# Patient Record
Sex: Female | Born: 1970 | Race: White | Hispanic: No | Marital: Married | Smoking: Former smoker
Health system: Southern US, Community
[De-identification: ages and names within clinical notes are randomized; demographics above are authoritative.]

## PROBLEM LIST (undated history)

## (undated) ENCOUNTER — Emergency Department: Payer: 59

## (undated) DIAGNOSIS — M47816 Spondylosis without myelopathy or radiculopathy, lumbar region: Secondary | ICD-10-CM

## (undated) DIAGNOSIS — Z8619 Personal history of other infectious and parasitic diseases: Secondary | ICD-10-CM

## (undated) DIAGNOSIS — F419 Anxiety disorder, unspecified: Secondary | ICD-10-CM

## (undated) DIAGNOSIS — K219 Gastro-esophageal reflux disease without esophagitis: Secondary | ICD-10-CM

## (undated) DIAGNOSIS — M719 Bursopathy, unspecified: Secondary | ICD-10-CM

## (undated) DIAGNOSIS — T7840XA Allergy, unspecified, initial encounter: Secondary | ICD-10-CM

## (undated) DIAGNOSIS — E785 Hyperlipidemia, unspecified: Secondary | ICD-10-CM

## (undated) DIAGNOSIS — I10 Essential (primary) hypertension: Secondary | ICD-10-CM

## (undated) DIAGNOSIS — M1990 Unspecified osteoarthritis, unspecified site: Secondary | ICD-10-CM

## (undated) DIAGNOSIS — E119 Type 2 diabetes mellitus without complications: Secondary | ICD-10-CM

## (undated) DIAGNOSIS — F32A Depression, unspecified: Secondary | ICD-10-CM

## (undated) DIAGNOSIS — G5603 Carpal tunnel syndrome, bilateral upper limbs: Secondary | ICD-10-CM

## (undated) HISTORY — DX: Unspecified osteoarthritis, unspecified site: M19.90

## (undated) HISTORY — DX: Allergy, unspecified, initial encounter: T78.40XA

## (undated) HISTORY — DX: Spondylosis without myelopathy or radiculopathy, lumbar region: M47.816

## (undated) HISTORY — DX: Bursopathy, unspecified: M71.9

## (undated) HISTORY — DX: Hyperlipidemia, unspecified: E78.5

## (undated) HISTORY — DX: Personal history of other infectious and parasitic diseases: Z86.19

## (undated) HISTORY — DX: Anxiety disorder, unspecified: F41.9

## (undated) HISTORY — PX: LESION EXCISION: SHX5167

## (undated) HISTORY — DX: Type 2 diabetes mellitus without complications: E11.9

## (undated) HISTORY — DX: Depression, unspecified: F32.A

---

## 1995-04-10 HISTORY — PX: TUBAL LIGATION: SHX77

## 2005-03-15 ENCOUNTER — Ambulatory Visit: Payer: Self-pay

## 2009-09-20 ENCOUNTER — Ambulatory Visit: Payer: Self-pay | Admitting: Family Medicine

## 2013-09-17 ENCOUNTER — Ambulatory Visit: Payer: Self-pay | Admitting: Emergency Medicine

## 2015-03-24 ENCOUNTER — Ambulatory Visit
Admission: EM | Admit: 2015-03-24 | Discharge: 2015-03-24 | Disposition: A | Discharge status: Home / Self Care | Payer: BLUE CROSS/BLUE SHIELD | Attending: Family Medicine | Admitting: Family Medicine

## 2015-03-24 ENCOUNTER — Encounter: Payer: Self-pay | Admitting: Emergency Medicine

## 2015-03-24 DIAGNOSIS — R05 Cough: Secondary | ICD-10-CM

## 2015-03-24 DIAGNOSIS — R059 Cough, unspecified: Secondary | ICD-10-CM

## 2015-03-24 DIAGNOSIS — J321 Chronic frontal sinusitis: Secondary | ICD-10-CM | POA: Diagnosis not present

## 2015-03-24 HISTORY — DX: Essential (primary) hypertension: I10

## 2015-03-24 LAB — RAPID STREP SCREEN (MED CTR MEBANE ONLY): Streptococcus, Group A Screen (Direct): NEGATIVE

## 2015-03-24 MED ORDER — CEFUROXIME AXETIL 250 MG PO TABS: ORAL_TABLET | ORAL | Status: DC

## 2015-03-24 MED ORDER — AZITHROMYCIN 500 MG PO TABS: ORAL_TABLET | ORAL | Status: DC

## 2015-03-24 MED ORDER — HYDROCOD POLST-CPM POLST ER 10-8 MG/5ML PO SUER: 5.0000 mL | Freq: Two times a day (BID) | ORAL | Status: DC | PRN

## 2015-03-24 NOTE — ED Notes (Signed)
Patient c/o sore throat and sinus congestion since last Friday.  Patient denies fevers.

## 2015-03-24 NOTE — ED Provider Notes (Signed)
CSN: [id number]     Arrival date & time 03/24/15  1102 History   None    Chief Complaint  Patient presents with  . Sore Throat  . Sinus Problem   (Consider location/radiation/quality/duration/timing/severity/associated sxs/prior Treatment) HPI  This 44 year old female presents with a one-week history of sore throat and sinus congestion. She states she has recurrent sinusitis in the past evaluated by an ENT wanted to perform surgery but she had vertigo that they had to cure first. She never went back for the sinus surgery to this point. 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 Several have been sick according to the patient. He is coughing a great deal of nighttime from pooling of secretions in her throat. Had some low-grade temperature last night although she is afebrile today. She states her left-sided sinuses the worse and tends to be stopped up "all the time".   Past Medical History  Diagnosis Date  . Hypertension    Past Surgical History  Procedure Laterality Date  . Tubal ligation     History reviewed. No pertinent family history. Social History  Substance Use Topics  . Smoking status: Former Research scientist (life sciences)  . Smokeless tobacco: None  . Alcohol Use: No   OB History    No data available     Review of Systems  Constitutional: Positive for fever and chills. Negative for diaphoresis and activity change.  HENT: Positive for congestion, postnasal drip, rhinorrhea, sinus pressure and sore throat.   Respiratory: Positive for cough.   All other systems reviewed and are negative.   Allergies  Codeine; Penicillins; and Krill oil  Home Medications   Prior to Admission medications   Medication Sig Start Date End Date Taking? Authorizing Provider  busPIRone (BUSPAR) 30 MG tablet Take 30 mg by mouth 2 (two) times daily.   Yes Historical Provider, MD  fluticasone (FLONASE) 50 MCG/ACT nasal spray Place 2 sprays into both nostrils daily.    Yes Historical Provider, MD  lovastatin (MEVACOR) 40 MG tablet Take 40 mg by mouth at bedtime.   Yes Historical Provider, MD  montelukast (SINGULAIR) 10 MG tablet Take 10 mg by mouth at bedtime.   Yes Historical Provider, MD  ramipril (ALTACE) 10 MG capsule Take 10 mg by mouth daily.   Yes Historical Provider, MD  cefUROXime (CEFTIN) 250 MG tablet Take one tablet BID with food 03/24/15   Lorin Picket, PA-C  chlorpheniramine-HYDROcodone [facility]) 10-8 MG/5ML SUER Take 5 mLs by mouth every 12 (twelve) hours as needed for cough. 03/24/15   Frederich Cha, MD   Meds Ordered and Administered this Visit  Medications - No data to display  BP 156/92 mmHg  Pulse 68  Temp(Src) 97.4 F (36.3 C) (Tympanic)  Resp 16  Ht 5\' 4"  (1.626 m)  Wt 186 lb (84.369 kg)  BMI 31.91 kg/m2  SpO2 100%  LMP 03/10/2015 (Approximate) No data found.   Physical Exam  Constitutional: She is oriented to person, place, and time. She appears well-developed and well-nourished. No distress.  HENT:  Head: Normocephalic and atraumatic.  Right Ear: External ear normal.  Left Ear: External ear normal.  Nose: Nose normal.  Mouth/Throat: Oropharynx is clear and moist. No oropharyngeal exudate.  Patient has significant tenderness to percussion over the frontal maxillary sinuses bilaterally.  Eyes: Conjunctivae are normal. Pupils are equal, round, and reactive to light.  Neck: Normal range of motion. Neck supple.  Pulmonary/Chest: Effort normal  and breath sounds normal. No respiratory distress. She has no wheezes. She has no rales.  Musculoskeletal: Normal range of motion. She exhibits no edema or tenderness.  Lymphadenopathy:    She has no cervical adenopathy.  Neurological: She is alert and oriented to person, place, and time.  Skin: Skin is warm and dry. She is not diaphoretic.  Psychiatric: She has a normal mood and affect. Her behavior is normal. Judgment normal.  Nursing note and vitals  reviewed.   ED Course  Procedures (including critical care time)  Labs Review Labs Reviewed  RAPID STREP SCREEN (NOT AT [facility])  CULTURE, GROUP A STREP ([facility] ONLY)    Imaging Review No results found.   Visual Acuity Review  Right Eye Distance:   Left Eye Distance:   Bilateral Distance:    Right Eye Near:   Left Eye Near:    Bilateral Near:         MDM   1. Cough   2. Sinusitis chronic, frontal    Discharge Medication List as of 03/24/2015  2:44 PM    START taking these medications   Details  cefUROXime (CEFTIN) 250 MG tablet Take one tablet BID with food, Print    chlorpheniramine-HYDROcodone (TUSSIONEX PENNKINETIC ER) 10-8 MG/5ML SUER Take 5 mLs by mouth every 12 (twelve) hours as needed for cough., Starting 03/24/2015, Until Discontinued, Normal      Plan: 1. Test/x-ray results and diagnosis reviewed with patient 2. rx as per orders; risks, benefits, potential side effects reviewed with patient 3. Recommend supportive treatment with rest and fluids. I recommended the use of Flonase for the next month to promote drainage. I will keep her out of work today and tomorrow to allow her to improve. She will return to work on Monday without restrictions. I recommended she follow-up with her primary care physician if she continued to have symptoms within a week or 2. 4. F/u prn if symptoms worsen or don't improve     Lorin Picket, PA-C 03/24/15 1458  Lorin Picket, PA-C 03/24/15 1459

## 2015-03-24 NOTE — Discharge Instructions (Signed)
Sinusitis, Adult Sinusitis is redness, soreness, and puffiness (inflammation) of the air pockets in the bones of your face (sinuses). The redness, soreness, and puffiness can cause air and mucus to get trapped in your sinuses. This can allow germs to grow and cause an infection.  HOME CARE   Drink enough fluids to keep your pee (urine) clear or pale yellow.  Use a humidifier in your home.  Run a hot shower to create steam in the bathroom. Sit in the bathroom with the door closed. Breathe in the steam 3-4 times a day.  Put a warm, moist washcloth on your face 3-4 times a day, or as told by your doctor.  Use salt water sprays (saline sprays) to wet the thick fluid in your nose. This can help the sinuses drain.  Only take medicine as told by your doctor. GET HELP RIGHT AWAY IF:   Your pain gets worse.  You have very bad headaches.  You are sick to your stomach (nauseous).  You throw up (vomit).  You are very sleepy (drowsy) all the time.  Your face is puffy (swollen).  Your vision changes.  You have a stiff neck.  You have trouble breathing. MAKE SURE YOU:   Understand these instructions.  Will watch your condition.  Will get help right away if you are not doing well or get worse.   This information is not intended to replace advice given to you by your health care provider. Make sure you discuss any questions you have with your health care provider.   Document Released: 09/12/2007 Document Revised: 04/16/2014 Document Reviewed: 10/30/2011 Elsevier Interactive Patient Education 2016 Elsevier Inc.  Cough, Adult A cough helps to clear your throat and lungs. A cough may last only 2-3 weeks (acute), or it may last longer than 8 weeks (chronic). Many different things can cause a cough. A cough may be a sign of an illness or another medical condition. HOME CARE  Pay attention to any changes in your cough.  Take medicines only as told by your doctor.  If you were  prescribed an antibiotic medicine, take it as told by your doctor. Do not stop taking it even if you start to feel better.  Talk with your doctor before you try using a cough medicine.  Drink enough fluid to keep your pee (urine) clear or pale yellow.  If the air is dry, use a cold steam vaporizer or humidifier in your home.  Stay away from things that make you cough at work or at home.  If your cough is worse at night, try using extra pillows to raise your head up higher while you sleep.  Do not smoke, and try not to be around smoke. If you need help quitting, ask your doctor.  Do not have caffeine.  Do not drink alcohol.  Rest as needed. GET HELP IF:  You have new problems (symptoms).  You cough up yellow fluid (pus).  Your cough does not get better after 2-3 weeks, or your cough gets worse.  Medicine does not help your cough and you are not sleeping well.  You have pain that gets worse or pain that is not helped with medicine.  You have a fever.  You are losing weight and you do not know why.  You have night sweats. GET HELP RIGHT AWAY IF:  You cough up blood.  You have trouble breathing.  Your heartbeat is very fast.   This information is not intended to replace advice  given to you by your health care provider. Make sure you discuss any questions you have with your health care provider.   Document Released: 12/07/2010 Document Revised: 12/15/2014 Document Reviewed: 06/02/2014 Elsevier Interactive Patient Education 2016 [place] may help relieve the symptoms of a cough and cold. They add moisture to the air, which helps mucus to become thinner and less sticky. This makes it easier to breathe and cough up secretions. Cool mist vaporizers do not cause serious burns like hot mist vaporizers, which may also be called steamers or humidifiers. Vaporizers have not been proven to help with colds. You should not use a vaporizer if you  are allergic to mold. HOME CARE INSTRUCTIONS  Follow the package instructions for the vaporizer.  Do not use anything other than distilled water in the vaporizer.  Do not run the vaporizer all of the time. This can cause mold or bacteria to grow in the vaporizer.  Clean the vaporizer after each time it is used.  Clean and dry the vaporizer well before storing it.  Stop using the vaporizer if worsening respiratory symptoms develop.   This information is not intended to replace advice given to you by your health care provider. Make sure you discuss any questions you have with your health care provider.   Document Released: 12/22/2003 Document Revised: 03/31/2013 Document Reviewed: 08/13/2012 Elsevier Interactive Patient Education 2016 Elsevier Inc.  Sinusitis, Adult Sinusitis is redness, soreness, and inflammation of the paranasal sinuses. Paranasal sinuses are air pockets within the bones of your face. They are located beneath your eyes, in the middle of your forehead, and above your eyes. In healthy paranasal sinuses, mucus is able to drain out, and air is able to circulate through them by way of your nose. However, when your paranasal sinuses are inflamed, mucus and air can become trapped. This can allow bacteria and other germs to grow and cause infection. Sinusitis can develop quickly and last only a short time (acute) or continue over a long period (chronic). Sinusitis that lasts for more than 12 weeks is considered chronic. CAUSES Causes of sinusitis include:  Allergies.  Structural abnormalities, such as displacement of the cartilage that separates your nostrils (deviated septum), which can decrease the air flow through your nose and sinuses and affect sinus drainage.  Functional abnormalities, such as when the small hairs (cilia) that line your sinuses and help remove mucus do not work properly or are not present. SIGNS AND SYMPTOMS Symptoms of acute and chronic sinusitis are  the same. The primary symptoms are pain and pressure around the affected sinuses. Other symptoms include:  Upper toothache.  Earache.  Headache.  Bad breath.  Decreased sense of smell and taste.  A cough, which worsens when you are lying flat.  Fatigue.  Fever.  Thick drainage from your nose, which often is green and may contain pus (purulent).  Swelling and warmth over the affected sinuses. DIAGNOSIS Your health care provider will perform a physical exam. During your exam, your health care provider may perform any of the following to help determine if you have acute sinusitis or chronic sinusitis:  Look in your nose for signs of abnormal growths in your nostrils (nasal polyps).  Tap over the affected sinus to check for signs of infection.  View the inside of your sinuses using an imaging device that has a light attached (endoscope). If your health care provider suspects that you have chronic sinusitis, one or more of the following  tests may be recommended:  Allergy tests.  Nasal culture. A sample of mucus is taken from your nose, sent to a lab, and screened for bacteria.  Nasal cytology. A sample of mucus is taken from your nose and examined by your health care provider to determine if your sinusitis is related to an allergy. TREATMENT Most cases of acute sinusitis are related to a viral infection and will resolve on their own within 10 days. Sometimes, medicines are prescribed to help relieve symptoms of both acute and chronic sinusitis. These may include pain medicines, decongestants, nasal steroid sprays, or saline sprays. However, for sinusitis related to a bacterial infection, your health care provider will prescribe antibiotic medicines. These are medicines that will help kill the bacteria causing the infection. Rarely, sinusitis is caused by a fungal infection. In these cases, your health care provider will prescribe antifungal medicine. For some cases of chronic  sinusitis, surgery is needed. Generally, these are cases in which sinusitis recurs more than 3 times per year, despite other treatments. HOME CARE INSTRUCTIONS  Drink plenty of water. Water helps thin the mucus so your sinuses can drain more easily.  Use a humidifier.  Inhale steam 3-4 times a day (for example, sit in the bathroom with the shower running).  Apply a warm, moist washcloth to your face 3-4 times a day, or as directed by your health care provider.  Use saline nasal sprays to help moisten and clean your sinuses.  Take medicines only as directed by your health care provider.  If you were prescribed either an antibiotic or antifungal medicine, finish it all even if you start to feel better. SEEK IMMEDIATE MEDICAL CARE IF:  You have increasing pain or severe headaches.  You have nausea, vomiting, or drowsiness.  You have swelling around your face.  You have vision problems.  You have a stiff neck.  You have difficulty breathing.   This information is not intended to replace advice given to you by your health care provider. Make sure you discuss any questions you have with your health care provider.   Document Released: 03/26/2005 Document Revised: 04/16/2014 Document Reviewed: 04/10/2011 Elsevier Interactive Patient Education [employer].

## 2015-03-26 LAB — CULTURE, GROUP A STREP (THRC)

## 2015-04-26 ENCOUNTER — Encounter (INDEPENDENT_AMBULATORY_CARE_PROVIDER_SITE_OTHER): Payer: Self-pay

## 2015-04-26 ENCOUNTER — Ambulatory Visit (INDEPENDENT_AMBULATORY_CARE_PROVIDER_SITE_OTHER): Payer: BLUE CROSS/BLUE SHIELD | Admitting: Family Medicine

## 2015-04-26 ENCOUNTER — Encounter: Payer: Self-pay | Admitting: Family Medicine

## 2015-04-26 VITALS — BP 138/90 | HR 86 | Temp 98.6°F | Resp 19 | Ht 64.0 in | Wt 195.9 lb

## 2015-04-26 DIAGNOSIS — E785 Hyperlipidemia, unspecified: Secondary | ICD-10-CM

## 2015-04-26 DIAGNOSIS — F419 Anxiety disorder, unspecified: Secondary | ICD-10-CM | POA: Diagnosis not present

## 2015-04-26 DIAGNOSIS — M1990 Unspecified osteoarthritis, unspecified site: Secondary | ICD-10-CM

## 2015-04-26 DIAGNOSIS — I10 Essential (primary) hypertension: Secondary | ICD-10-CM | POA: Insufficient documentation

## 2015-04-26 DIAGNOSIS — J3089 Other allergic rhinitis: Secondary | ICD-10-CM | POA: Insufficient documentation

## 2015-04-26 NOTE — Progress Notes (Signed)
Name: Morgan Gordon   MRN: [id number]    DOB: 1970/09/28   Date:04/26/2015       Progress Note  Subjective  Chief Complaint  Chief Complaint  Patient presents with  . Establish Care    NP    Hypertension This is a chronic problem. The problem is controlled. Pertinent negatives include no chest pain, headaches, palpitations or shortness of breath. Past treatments include ACE inhibitors. The current treatment provides significant improvement. There is no history of kidney disease, CAD/MI or CVA.    Pt. Is here to establish care. PCP Dr. Doren Custard, who is nearing retirement.  Records not available for review.  Past Medical History  Diagnosis Date  . Hypertension   . Anxiety   . Bursitis     arms and neck  . Arthritis     Osteoarthritis in hips  . Facet syndrome, lumbar     Sees Chiropractor in [place]  . Hyperlipidemia   . Allergy     Year-round allergies and sinus infections.  . History of genital warts     Had D and C    Past Surgical History  Procedure Laterality Date  . Tubal ligation  1997    Family History  Problem Relation Age of Onset  . Thyroid disease Mother   . Cancer Mother     breast  . Diabetes Mother   . Cancer Father     lymph node cancer  . Diabetes Father   . Diabetes Brother   . Thyroid disease Brother     Social History   Social History  . Marital Status: Married    Spouse Name: N/A  . Number of Children: N/A  . Years of Education: N/A   Occupational History  . Not on file.   Social History Main Topics  . Smoking status: Former Smoker -- 0.50 packs/day    Types: Cigarettes    Quit date: 06/24/2013  . Smokeless tobacco: Not on file  . Alcohol Use: No  . Drug Use: No  . Sexual Activity: Yes   Other Topics Concern  . Not on file   Social History Narrative     Current outpatient prescriptions:  .  aspirin 81 MG tablet, Take 81 mg by mouth daily., Disp: , Rfl:  .  busPIRone (BUSPAR) 30 MG tablet, Take 30 mg by mouth 2  (two) times daily., Disp: , Rfl:  .  Cinnamon 500 MG capsule, Take 500 mg by mouth daily., Disp: , Rfl:  .  fluticasone (FLONASE) 50 MCG/ACT nasal spray, Place 2 sprays into both nostrils daily., Disp: , Rfl:  .  lovastatin (MEVACOR) 40 MG tablet, Take 40 mg by mouth at bedtime., Disp: , Rfl:  .  montelukast (SINGULAIR) 10 MG tablet, Take 10 mg by mouth at bedtime., Disp: , Rfl:  .  ramipril (ALTACE) 10 MG capsule, Take 10 mg by mouth daily., Disp: , Rfl:   Allergies  Allergen Reactions  . Codeine Hives  . Penicillins Hives  . Krill Oil Rash     Review of Systems  Constitutional: Negative for fever, chills and weight loss.  Respiratory: Negative for shortness of breath.   Cardiovascular: Negative for chest pain and palpitations.  Neurological: Negative for headaches.   Objective  Filed Vitals:   04/26/15 1404 04/26/15 1515  BP: 150/90 138/90  Pulse: 86   Temp: 98.6 F (37 C)   TempSrc: Oral   Resp: 19   Height: 5\' 4"  (1.626 m)   Weight:  195 lb 14.4 oz (88.86 kg)   SpO2: 98%     Physical Exam  Constitutional: She is oriented to person, place, and time and well-developed, well-nourished, and in no distress.  HENT:  Head: Normocephalic and atraumatic.  Eyes: Pupils are equal, round, and reactive to light.  Cardiovascular: Normal rate, regular rhythm and normal heart sounds.   Pulmonary/Chest: Effort normal and breath sounds normal.  Abdominal: Soft. Bowel sounds are normal.  Musculoskeletal:       Right ankle: She exhibits no swelling.       Left ankle: She exhibits no swelling.  Neurological: She is alert and oriented to person, place, and time.  Nursing note and vitals reviewed.     Assessment & Plan  1. Essential hypertension BP elevated, improved on manual repeat. We'll continue on ramipril at present dosage and repeat BP logs in 4 weeks.  2. Arthritis We'll request records from Dr. Arnette Norris  3. Anxiety Stable on buspirone. Continue present therapy for  now.  4. Hyperlipidemia Obtain FLP at patient's next OV.   Morgan Gordon A. [facility] 04/26/2015 3:15 PM

## 2015-05-10 ENCOUNTER — Ambulatory Visit (INDEPENDENT_AMBULATORY_CARE_PROVIDER_SITE_OTHER): Payer: BLUE CROSS/BLUE SHIELD | Admitting: Family Medicine

## 2015-05-10 ENCOUNTER — Encounter: Payer: Self-pay | Admitting: Family Medicine

## 2015-05-10 ENCOUNTER — Other Ambulatory Visit: Payer: Self-pay

## 2015-05-10 VITALS — BP 128/78 | HR 96 | Temp 97.7°F | Resp 16 | Ht 64.3 in | Wt 192.7 lb

## 2015-05-10 DIAGNOSIS — J0100 Acute maxillary sinusitis, unspecified: Secondary | ICD-10-CM

## 2015-05-10 DIAGNOSIS — E669 Obesity, unspecified: Secondary | ICD-10-CM | POA: Insufficient documentation

## 2015-05-10 DIAGNOSIS — J069 Acute upper respiratory infection, unspecified: Secondary | ICD-10-CM | POA: Diagnosis not present

## 2015-05-10 DIAGNOSIS — Z23 Encounter for immunization: Secondary | ICD-10-CM

## 2015-05-10 MED ORDER — CEFUROXIME AXETIL 250 MG PO TABS: 250.0000 mg | ORAL_TABLET | Freq: Two times a day (BID) | ORAL | Status: DC

## 2015-05-10 MED ORDER — FLUTICASONE PROPIONATE 50 MCG/ACT NA SUSP: 2.0000 | Freq: Every day | NASAL | Status: DC

## 2015-05-10 NOTE — Progress Notes (Signed)
Name: Morgan Gordon   MRN: [id number]    DOB: [birth year]   Date:05/10/2015       Progress Note  Subjective  Chief Complaint  Chief Complaint  Patient presents with  . URI    Onset Saturday, sinus pressure/pain, chills, bilateral ear pressure and pain, nasal congestion, coughing up yellow mucus. Has tried Mucinex, Neti-Pot and nasal decongestant with some relief.     HPI  URI: she states that she developed chills, nasal congestion, facial pressure and  rhinorrhea, post-nasal drainage, cough, no wheezing or SOB Symptoms are worse at night and better during the day. She has been taking otc medication with some relief of symptoms.    Patient Active Problem List   Diagnosis Date Noted  . Obesity 05/10/2015  . Hypertension 04/26/2015  . Arthritis 04/26/2015  . Anxiety 04/26/2015  . Hyperlipidemia 04/26/2015  . Allergic rhinitis 04/26/2015    Past Surgical History  Procedure Laterality Date  . Tubal ligation  1997    Family History  Problem Relation Age of Onset  . Thyroid disease Mother   . Cancer Mother     breast  . Diabetes Mother   . Cancer Father     lymph node cancer  . Diabetes Father   . Diabetes Brother   . Thyroid disease Brother     Social History   Social History  . Marital Status: Married    Spouse Name: N/A  . Number of Children: N/A  . Years of Education: N/A   Occupational History  . Not on file.   Social History Main Topics  . Smoking status: Former Smoker -- 0.50 packs/day    Types: Cigarettes    Quit date: 06/24/2013  . Smokeless tobacco: Not on file  . Alcohol Use: No  . Drug Use: No  . Sexual Activity: Yes   Other Topics Concern  . Not on file   Social History Narrative     Current outpatient prescriptions:  .  aspirin 81 MG tablet, Take 81 mg by mouth daily., Disp: , Rfl:  .  busPIRone (BUSPAR) 30 MG tablet, Take 30 mg by mouth 2 (two) times daily., Disp: , Rfl:  .  Cinnamon 500 MG capsule, Take 500 mg by mouth daily.,  Disp: , Rfl:  .  fluticasone (FLONASE) 50 MCG/ACT nasal spray, Place 2 sprays into both nostrils daily., Disp: , Rfl:  .  lovastatin (MEVACOR) 40 MG tablet, Take 40 mg by mouth at bedtime., Disp: , Rfl:  .  montelukast (SINGULAIR) 10 MG tablet, Take 10 mg by mouth at bedtime., Disp: , Rfl:  .  ramipril (ALTACE) 10 MG capsule, Take 10 mg by mouth daily., Disp: , Rfl:   Allergies  Allergen Reactions  . Codeine Hives  . Penicillins Hives  . Krill Oil Rash     ROS  Ten systems reviewed and is negative except as mentioned in HPI   Objective  Filed Vitals:   05/10/15 1426  BP: 128/78  Pulse: 96  Temp: 97.7 F (36.5 C)  TempSrc: Oral  Resp: 16  Height: 5' 4.3" (1.633 m)  Weight: 192 lb 11.2 oz (87.408 kg)  SpO2: 99%    Body mass index is 32.78 kg/(m^2).  Physical Exam  Constitutional: Patient appears well-developed and well-nourished. Obese No distress.  HEENT: head atraumatic, normocephalic, pupils equal and reactive to light, ears normal TM bilaterally, mild white post-nasal drainage, pain on maxillary sinus, but left worse than right side, neck supple Cardiovascular: Normal rate,  regular rhythm and normal heart sounds.  No murmur heard. No BLE edema. Pulmonary/Chest: Effort normal and breath sounds normal. No respiratory distress. Abdominal: Soft.  There is no tenderness. Psychiatric: Patient has a normal mood and affect. behavior is normal. Judgment and thought content normal.  Recent Results (from the past 2160 hour(s))  Rapid strep screen     Status: None   Collection Time: 03/24/15  1:46 PM  Result Value Ref Range   Streptococcus, Group A Screen (Direct) NEGATIVE NEGATIVE  Culture, group A strep ([facility] only)     Status: None   Collection Time: 03/24/15  1:46 PM  Result Value Ref Range   Specimen Description THROAT    Special Requests NONE    Culture NO BETA HEMOLYTIC STREPTOCOCCI ISOLATED    Report Status 03/26/2015 FINAL     PHQ2/9: Depression screen PHQ 2/9  04/26/2015  Decreased Interest 0  Down, Depressed, Hopeless 0  PHQ - 2 Score 0    Fall Risk: Fall Risk  04/26/2015  Falls in the past year? No     Assessment & Plan  1. Upper respiratory infection  Discussed otc medication and neti pot  2. Acute maxillary sinusitis, recurrence not specified  Explained that it is likely viral, she can hold off on filling prescription, she is allergic to penicillin but has taken Cefuroxime in the past - cefUROXime (CEFTIN) 250 MG tablet; Take 1 tablet (250 mg total) by mouth 2 (two) times daily with a meal.  Dispense: 20 tablet; Refill: 0   3. Need for Tdap vaccination  - Tdap vaccine greater than or equal to 7yo IM

## 2015-05-10 NOTE — Telephone Encounter (Signed)
Patient requesting refill. 

## 2015-06-02 ENCOUNTER — Encounter: Payer: Self-pay | Admitting: Family Medicine

## 2015-06-02 ENCOUNTER — Ambulatory Visit (INDEPENDENT_AMBULATORY_CARE_PROVIDER_SITE_OTHER): Payer: BLUE CROSS/BLUE SHIELD | Admitting: Family Medicine

## 2015-06-02 VITALS — BP 126/70 | HR 86 | Temp 98.8°F | Resp 17 | Ht 64.0 in | Wt 188.3 lb

## 2015-06-02 DIAGNOSIS — I10 Essential (primary) hypertension: Secondary | ICD-10-CM | POA: Diagnosis not present

## 2015-06-02 DIAGNOSIS — Z833 Family history of diabetes mellitus: Secondary | ICD-10-CM | POA: Insufficient documentation

## 2015-06-02 DIAGNOSIS — E785 Hyperlipidemia, unspecified: Secondary | ICD-10-CM

## 2015-06-02 LAB — POCT GLYCOSYLATED HEMOGLOBIN (HGB A1C): Hemoglobin A1C: 5.9

## 2015-06-02 NOTE — Progress Notes (Signed)
Name: Morgan Gordon   MRN: [id number]    DOB: [birth year]   Date:06/02/2015       Progress Note  Subjective  Chief Complaint  Chief Complaint  Patient presents with  . Follow-up    1 mo  . Hypertension  . Hyperlipidemia  . Anxiety    Hypertension This is a chronic problem. The problem is controlled. Pertinent negatives include no headaches or shortness of breath. Past treatments include ACE inhibitors.  Hyperlipidemia This is a chronic problem. Pertinent negatives include no leg pain, myalgias or shortness of breath. Current antihyperlipidemic treatment includes statins.   Family History of Diabetes Strong family history of Diabetes (mom, dad, and brother). In the past, she was 'borderline' but is concerned that with smoking cessation, she has gained some weight. Will test for HgbA1c.   Past Medical History  Diagnosis Date  . Hypertension   . Anxiety   . Bursitis     arms and neck  . Arthritis     Osteoarthritis in hips  . Facet syndrome, lumbar     Sees Chiropractor in [place]  . Hyperlipidemia   . Allergy     Year-round allergies and sinus infections.  . History of genital warts     Had D and C    Past Surgical History  Procedure Laterality Date  . Tubal ligation  1997    Family History  Problem Relation Age of Onset  . Thyroid disease Mother   . Cancer Mother     breast  . Diabetes Mother   . Cancer Father     lymph node cancer  . Diabetes Father   . Diabetes Brother   . Thyroid disease Brother     Social History   Social History  . Marital Status: Married    Spouse Name: N/A  . Number of Children: N/A  . Years of Education: N/A   Occupational History  . Not on file.   Social History Main Topics  . Smoking status: Former Smoker -- 0.50 packs/day    Types: Cigarettes    Quit date: 06/24/2013  . Smokeless tobacco: Not on file  . Alcohol Use: No  . Drug Use: No  . Sexual Activity: Yes   Other Topics Concern  . Not on file   Social  History Narrative     Current outpatient prescriptions:  .  aspirin 81 MG tablet, Take 81 mg by mouth daily., Disp: , Rfl:  .  busPIRone (BUSPAR) 30 MG tablet, Take 30 mg by mouth 2 (two) times daily., Disp: , Rfl:  .  Cinnamon 500 MG capsule, Take 500 mg by mouth daily., Disp: , Rfl:  .  fluticasone (FLONASE) 50 MCG/ACT nasal spray, Place 2 sprays into both nostrils daily., Disp: 16 g, Rfl: 2 .  lovastatin (MEVACOR) 40 MG tablet, Take 40 mg by mouth at bedtime., Disp: , Rfl:  .  montelukast (SINGULAIR) 10 MG tablet, Take 10 mg by mouth at bedtime., Disp: , Rfl:  .  ramipril (ALTACE) 10 MG capsule, Take 10 mg by mouth daily., Disp: , Rfl:   Allergies  Allergen Reactions  . Codeine Hives  . Penicillins Hives  . Krill Oil Rash     Review of Systems  Respiratory: Negative for shortness of breath.   Musculoskeletal: Negative for myalgias.  Neurological: Negative for headaches.     Objective  Filed Vitals:   06/02/15 0828  BP: 126/70  Pulse: 86  Temp: 98.8 F (37.1 C)  TempSrc:  Oral  Resp: 17  Height: 5\' 4"  (1.626 m)  Weight: 188 lb 4.8 oz (85.412 kg)  SpO2: 97%    Physical Exam  Constitutional: She is oriented to person, place, and time and well-developed, well-nourished, and in no distress.  HENT:  Head: Normocephalic and atraumatic.  Cardiovascular: Normal rate and regular rhythm.   Pulmonary/Chest: Effort normal and breath sounds normal.  Neurological: She is alert and oriented to person, place, and time.  Nursing note and vitals reviewed.    Assessment & Plan  1. Family history of diabetes mellitus in mother  - POCT HgB A1C  2. Essential hypertension BP at goal. - Basic Metabolic Panel (BMET)  3. Hyperlipidemia Obtain lipid panel and liver enzymes. Continue on statin therapy. - Lipid Profile - Hepatic function panel   Shanena Pellegrino [facility] 06/02/2015 8:37 AM

## 2015-06-03 LAB — LIPID PANEL
Chol/HDL Ratio: 3.4 ratio units (ref 0.0–4.4)
Cholesterol, Total: 150 mg/dL (ref 100–199)
HDL: 44 mg/dL (ref >39)
LDL Calculated: 96 mg/dL (ref 0–99)
Triglycerides: 49 mg/dL (ref 0–149)
VLDL Cholesterol Cal: 10 mg/dL (ref 5–40)

## 2015-06-03 LAB — HEPATIC FUNCTION PANEL
ALT: 19 IU/L (ref 0–32)
AST: 16 IU/L (ref 0–40)
Albumin: 4.5 g/dL (ref 3.5–5.5)
Alkaline Phosphatase: 48 IU/L (ref 39–117)
Bilirubin Total: 0.3 mg/dL (ref 0.0–1.2)
Bilirubin, Direct: 0.11 mg/dL (ref 0.00–0.40)
Total Protein: 7.4 g/dL (ref 6.0–8.5)

## 2015-06-03 LAB — BASIC METABOLIC PANEL
BUN/Creatinine Ratio: 16 (ref 9–23)
BUN: 15 mg/dL (ref 6–24)
CO2: 20 mmol/L (ref 18–29)
Calcium: 9.3 mg/dL (ref 8.7–10.2)
Chloride: 101 mmol/L (ref 96–106)
Creatinine, Ser: 0.93 mg/dL (ref 0.57–1.00)
GFR calc Af Amer: 86 mL/min/{1.73_m2} (ref >59)
GFR calc non Af Amer: 75 mL/min/{1.73_m2} (ref >59)
Glucose: 86 mg/dL (ref 65–99)
Potassium: 5.2 mmol/L (ref 3.5–5.2)
Sodium: 142 mmol/L (ref 134–144)

## 2015-06-17 ENCOUNTER — Other Ambulatory Visit: Payer: Self-pay | Admitting: Family Medicine

## 2015-07-24 ENCOUNTER — Encounter: Payer: Self-pay | Admitting: *Deleted

## 2015-07-24 ENCOUNTER — Ambulatory Visit
Admission: EM | Admit: 2015-07-24 | Discharge: 2015-07-24 | Disposition: A | Discharge status: Home / Self Care | Payer: 59 | Attending: Family Medicine | Admitting: Family Medicine

## 2015-07-24 DIAGNOSIS — K111 Hypertrophy of salivary gland: Secondary | ICD-10-CM

## 2015-07-24 DIAGNOSIS — R600 Localized edema: Secondary | ICD-10-CM

## 2015-07-24 DIAGNOSIS — R42 Dizziness and giddiness: Secondary | ICD-10-CM | POA: Diagnosis not present

## 2015-07-24 DIAGNOSIS — R609 Edema, unspecified: Principal | ICD-10-CM

## 2015-07-24 MED ORDER — MECLIZINE HCL 25 MG PO TABS: ORAL_TABLET | ORAL | Status: DC

## 2015-07-24 MED ORDER — CLINDAMYCIN HCL 300 MG PO CAPS: ORAL_CAPSULE | ORAL | Status: DC

## 2015-07-24 NOTE — ED Notes (Signed)
Pt states that she feels that she has dizziness that started this week, pt feels like she has a saliva gland that is infected.

## 2015-07-24 NOTE — ED Provider Notes (Signed)
CSN: [id number]     Arrival date & time 07/24/15  1111 History   First MD Initiated Contact with Patient 07/24/15 1204     Chief Complaint  Patient presents with  . Dizziness   (Consider location/radiation/quality/duration/timing/severity/associated sxs/prior Treatment) HPI: Patient presents today with symptoms of left salivary gland swelling and pain. Patient states that she has had some dizziness with movement of the head which she relates to vertigo which started at the same time. She has noticed these symptoms over the last 4-5 days. She states she has a history of salivary gland stone. She has been told in the past that she needs start more water to help resolve this. She states she saw her dentist last week. She denies any dental pain. She denies any fever or chills, chest pain, shortness of breath, nausea, vomiting.  Past Medical History  Diagnosis Date  . Hypertension   . Anxiety   . Bursitis     arms and neck  . Arthritis     Osteoarthritis in hips  . Facet syndrome, lumbar     Sees Chiropractor in [place]  . Hyperlipidemia   . Allergy     Year-round allergies and sinus infections.  . History of genital warts     Had D and C   Past Surgical History  Procedure Laterality Date  . Tubal ligation  1997   Family History  Problem Relation Age of Onset  . Thyroid disease Mother   . Cancer Mother     breast  . Diabetes Mother   . Cancer Father     lymph node cancer  . Diabetes Father   . Diabetes Brother   . Thyroid disease Brother    Social History  Substance Use Topics  . Smoking status: Former Smoker -- 0.50 packs/day    Types: Cigarettes    Quit date: 06/24/2013  . Smokeless tobacco: None  . Alcohol Use: No   OB History    No data available     Review of Systems: Negative except mentioned.   Allergies  Codeine; Penicillins; and Krill oil  Home Medications   Prior to Admission medications   Medication Sig Start Date End Date Taking? Authorizing  Provider  aspirin 81 MG tablet Take 81 mg by mouth daily.    Historical Provider, MD  busPIRone (BUSPAR) 30 MG tablet Take 30 mg by mouth 2 (two) times daily.    Historical Provider, MD  Cinnamon 500 MG capsule Take 500 mg by mouth daily.    Historical Provider, MD  clindamycin (CLEOCIN) 300 MG capsule Take one tab bid x 7 days. 07/24/15   Paulina Fusi, MD  fluticasone (FLONASE) 50 MCG/ACT nasal spray Place 2 sprays into both nostrils daily. 05/10/15   Steele Sizer, MD  lovastatin (MEVACOR) 40 MG tablet Take 40 mg by mouth at bedtime.    Historical Provider, MD  meclizine (ANTIVERT) 25 MG tablet 1 tab tid prn vertigo. 07/24/15   Paulina Fusi, MD  montelukast (SINGULAIR) 10 MG tablet Take 10 mg by mouth at bedtime.    Historical Provider, MD  ramipril (ALTACE) 10 MG capsule TAKE ONE CAPSULE BY MOUTH IN THE MORNING 06/18/15   Roselee Nova, MD   Meds Ordered and Administered this Visit  Medications - No data to display  BP 137/85 mmHg  Pulse 58  Temp(Src) 98.1 F (36.7 C) (Oral)  Ht 5' 4.75" (1.645 m)  Wt 183 lb (83.008 kg)  BMI 30.68 kg/m2  SpO2 100%  LMP 07/13/2015 No data found.   Physical Exam   GENERAL: NAD HEENT: no pharyngeal erythema, no exudate, no erythema of TMs, no cervical LAD, mild tenderness and swelling along left jaw area extending toward parotid gland RESP: CTA B CARD: RRR NEURO: CN II-XII grossly intact   ED Course  Procedures (including critical care time)  Labs Review Labs Reviewed - No data to display  Imaging Review No results found.   MDM   1. Salivary gland swelling   2. Vertigo    Will treat patient with clindamycin given patient's allergy to penicillin. Encourage patient to suck on lemon drops/candy. I've asked that she follow up with her ENT in the next few days. She should not drive while she is having symptoms of vertigo. I've advised that she drink plenty of water. I have given her prescription for meclizine if needed for the vertigo.  Seek immediate medical attention if symptoms worsen.     Paulina Fusi, MD 07/24/15 1224

## 2015-07-28 ENCOUNTER — Other Ambulatory Visit: Payer: Self-pay | Admitting: Otolaryngology

## 2015-07-28 DIAGNOSIS — K1120 Sialoadenitis, unspecified: Secondary | ICD-10-CM

## 2015-08-04 ENCOUNTER — Ambulatory Visit
Admission: RE | Admit: 2015-08-04 | Discharge: 2015-08-04 | Disposition: A | Discharge status: Home / Self Care | Payer: 59 | Source: Ambulatory Visit | Attending: Otolaryngology | Admitting: Otolaryngology

## 2015-08-04 DIAGNOSIS — K1120 Sialoadenitis, unspecified: Secondary | ICD-10-CM | POA: Diagnosis present

## 2015-08-04 MED ORDER — IOHEXOL 300 MG/ML  SOLN
75.0000 mL | Freq: Once | INTRAMUSCULAR | Status: AC | PRN
  Administered 2015-08-04: 75 mL via INTRAVENOUS

## 2015-08-04 MED ORDER — IOPAMIDOL (ISOVUE-300) INJECTION 61%: 75.0000 mL | Freq: Once | INTRAVENOUS | Status: DC | PRN

## 2015-08-12 ENCOUNTER — Other Ambulatory Visit: Payer: Self-pay | Admitting: Family Medicine

## 2015-08-30 ENCOUNTER — Ambulatory Visit (INDEPENDENT_AMBULATORY_CARE_PROVIDER_SITE_OTHER): Payer: 59 | Admitting: Family Medicine

## 2015-08-30 ENCOUNTER — Ambulatory Visit
Admission: RE | Admit: 2015-08-30 | Discharge: 2015-08-30 | Disposition: A | Discharge status: Home / Self Care | Payer: 59 | Source: Ambulatory Visit | Attending: Family Medicine | Admitting: Family Medicine

## 2015-08-30 ENCOUNTER — Encounter: Payer: Self-pay | Admitting: Family Medicine

## 2015-08-30 VITALS — BP 124/70 | HR 70 | Temp 98.7°F | Resp 16 | Ht 65.0 in | Wt 188.2 lb

## 2015-08-30 DIAGNOSIS — M25511 Pain in right shoulder: Secondary | ICD-10-CM | POA: Insufficient documentation

## 2015-08-30 DIAGNOSIS — G8929 Other chronic pain: Secondary | ICD-10-CM | POA: Diagnosis present

## 2015-08-30 NOTE — Progress Notes (Signed)
Name: Morgan Gordon   MRN: [id number]    DOB: 29-Jun-1970   Date:08/30/2015       Progress Note  Subjective  Chief Complaint  Chief Complaint  Patient presents with  . Follow-up    3 month follow up    HPI  R. Shoulder Pain: Pain present for a few years, worse in the last few months, worse with overhead activity and raising her arm up. She cannot sleep on her side due to her arm going numb, has to keep moving her hand while driving. Right distal middle finger stays numb (has been for 3-4 years). No neck pain, fever, chills, loss of motion of the joint.    Past Medical History  Diagnosis Date  . Hypertension   . Anxiety   . Bursitis     arms and neck  . Arthritis     Osteoarthritis in hips  . Facet syndrome, lumbar     Sees Chiropractor in [place]  . Hyperlipidemia   . Allergy     Year-round allergies and sinus infections.  . History of genital warts     Had D and C    Past Surgical History  Procedure Laterality Date  . Tubal ligation  1997    Family History  Problem Relation Age of Onset  . Thyroid disease Mother   . Cancer Mother     breast  . Diabetes Mother   . Cancer Father     lymph node cancer  . Diabetes Father   . Diabetes Brother   . Thyroid disease Brother     Social History   Social History  . Marital Status: Married    Spouse Name: N/A  . Number of Children: N/A  . Years of Education: N/A   Occupational History  . Not on file.   Social History Main Topics  . Smoking status: Former Smoker -- 0.50 packs/day    Types: Cigarettes    Quit date: 06/24/2013  . Smokeless tobacco: Not on file  . Alcohol Use: No  . Drug Use: No  . Sexual Activity: Yes   Other Topics Concern  . Not on file   Social History Narrative     Current outpatient prescriptions:  .  aspirin 81 MG tablet, Take 81 mg by mouth daily., Disp: , Rfl:  .  busPIRone (BUSPAR) 30 MG tablet, Take 30 mg by mouth 2 (two) times daily., Disp: , Rfl:  .  Cinnamon 500 MG  capsule, Take 500 mg by mouth daily., Disp: , Rfl:  .  fluticasone (FLONASE) 50 MCG/ACT nasal spray, Place 2 sprays into both nostrils daily., Disp: 16 g, Rfl: 2 .  lovastatin (MEVACOR) 40 MG tablet, TAKE 1 TABLET EACH EVENING, Disp: 90 tablet, Rfl: 1 .  montelukast (SINGULAIR) 10 MG tablet, Take 10 mg by mouth at bedtime., Disp: , Rfl:  .  ramipril (ALTACE) 10 MG capsule, TAKE ONE CAPSULE BY MOUTH IN THE MORNING, Disp: 30 capsule, Rfl: 4  Allergies  Allergen Reactions  . Codeine Hives  . Penicillins Hives  . Krill Oil Rash     Review of Systems  Constitutional: Negative for fever and chills.  Musculoskeletal: Negative for joint pain.    Objective  Filed Vitals:   08/30/15 1331  BP: 124/70  Pulse: 70  Temp: 98.7 F (37.1 C)  TempSrc: Oral  Resp: 16  Height: 5\' 5"  (1.651 m)  Weight: 188 lb 3.2 oz (85.367 kg)  SpO2: 98%    Physical Exam  Constitutional: She is well-developed, well-nourished, and in no distress.  Musculoskeletal:       Right shoulder: She exhibits tenderness and pain.  Right shoulder tenderness to palpation, raised knot-like area on middle clavicle. Pain worse with internal and external rotation, abduction.  Nursing note and vitals reviewed.        Assessment & Plan  1. Chronic right shoulder pain Obtain x-rays for evaluation of chronic right shoulder pain, may eventually need an MRI. - DG Shoulder Right; Future    Morgan Gordon [facility] 08/30/2015 1:43 PM

## 2015-08-31 ENCOUNTER — Telehealth: Payer: Self-pay | Admitting: Family Medicine

## 2015-08-31 NOTE — Telephone Encounter (Signed)
Patient received call and was given test results. Was told that she would need MRI and she was checking status. Please return call

## 2015-09-16 NOTE — Telephone Encounter (Signed)
Results have been reported to patient 

## 2015-10-19 ENCOUNTER — Telehealth: Payer: Self-pay | Admitting: Family Medicine

## 2015-10-19 ENCOUNTER — Other Ambulatory Visit: Payer: Self-pay | Admitting: Family Medicine

## 2015-10-19 DIAGNOSIS — J309 Allergic rhinitis, unspecified: Secondary | ICD-10-CM

## 2015-10-19 MED ORDER — MONTELUKAST SODIUM 10 MG PO TABS: 10.0000 mg | ORAL_TABLET | Freq: Every day | ORAL | Status: DC

## 2015-10-19 NOTE — Telephone Encounter (Signed)
Prescription for Singulair is sent to patient's pharmacy

## 2015-10-19 NOTE — Telephone Encounter (Signed)
PT SAID THAT [facility] IN [place] HAS SENT IN SEVERAL REQUEST FOR HIS GENERIC TO SINGULAR WITH NO RESPONSE. SHE IS GOING OUT OF TOWN LEAVING TOMORROW AND NEEDS THIS DONE TODAY. SHE WANTS TO KNOW WHAT THE HOLD UP IS. PLEASE ADVISE

## 2015-10-21 ENCOUNTER — Other Ambulatory Visit: Payer: Self-pay | Admitting: Family Medicine

## 2015-11-09 ENCOUNTER — Other Ambulatory Visit: Payer: Self-pay | Admitting: Family Medicine

## 2015-12-01 ENCOUNTER — Other Ambulatory Visit: Payer: Self-pay | Admitting: Family Medicine

## 2016-01-06 ENCOUNTER — Other Ambulatory Visit: Payer: Self-pay | Admitting: Family Medicine

## 2016-01-06 DIAGNOSIS — J309 Allergic rhinitis, unspecified: Secondary | ICD-10-CM

## 2016-01-12 ENCOUNTER — Encounter: Payer: Self-pay | Admitting: Family Medicine

## 2016-01-12 ENCOUNTER — Ambulatory Visit (INDEPENDENT_AMBULATORY_CARE_PROVIDER_SITE_OTHER): Payer: 59 | Admitting: Family Medicine

## 2016-01-12 VITALS — BP 122/84 | HR 74 | Temp 98.6°F | Resp 16 | Ht 65.0 in | Wt 185.5 lb

## 2016-01-12 DIAGNOSIS — I10 Essential (primary) hypertension: Secondary | ICD-10-CM | POA: Diagnosis not present

## 2016-01-12 DIAGNOSIS — J3089 Other allergic rhinitis: Secondary | ICD-10-CM | POA: Diagnosis not present

## 2016-01-12 DIAGNOSIS — Z23 Encounter for immunization: Secondary | ICD-10-CM

## 2016-01-12 DIAGNOSIS — E785 Hyperlipidemia, unspecified: Secondary | ICD-10-CM

## 2016-01-12 MED ORDER — MONTELUKAST SODIUM 10 MG PO TABS: 10.0000 mg | ORAL_TABLET | Freq: Every day | ORAL | 1 refills | Status: DC

## 2016-01-12 MED ORDER — LOVASTATIN 40 MG PO TABS: ORAL_TABLET | ORAL | 1 refills | Status: DC

## 2016-01-12 MED ORDER — RAMIPRIL 10 MG PO CAPS: 10.0000 mg | ORAL_CAPSULE | Freq: Every morning | ORAL | 1 refills | Status: DC

## 2016-01-12 NOTE — Progress Notes (Signed)
Name: Morgan Gordon   MRN: [id number]    DOB: 1971-03-05   Date:01/12/2016       Progress Note  Subjective  Chief Complaint  Chief Complaint  Patient presents with  . Hypertension    medication refills  . Hyperlipidemia  . Allergic Rhinitis     follow up    Hypertension  This is a chronic problem. The problem is unchanged. Pertinent negatives include no blurred vision, chest pain, headaches, palpitations or shortness of breath. Past treatments include ACE inhibitors. There is no history of kidney disease, CAD/MI or CVA.  Hyperlipidemia  This is a chronic problem. The problem is controlled. Recent lipid tests were reviewed and are normal. Pertinent negatives include no chest pain, leg pain, myalgias or shortness of breath. Current antihyperlipidemic treatment includes statins.     Past Medical History:  Diagnosis Date  . Allergy    Year-round allergies and sinus infections.  . Anxiety   . Arthritis    Osteoarthritis in hips  . Bursitis    arms and neck  . Facet syndrome, lumbar    Sees Chiropractor in [place]  . History of genital warts    Had D and C  . Hyperlipidemia   . Hypertension     Past Surgical History:  Procedure Laterality Date  . TUBAL LIGATION  1997    Family History  Problem Relation Age of Onset  . Thyroid disease Mother   . Cancer Mother     breast  . Diabetes Mother   . Cancer Father     lymph node cancer  . Diabetes Father   . Diabetes Brother   . Thyroid disease Brother     Social History   Social History  . Marital status: Married    Spouse name: N/A  . Number of children: N/A  . Years of education: N/A   Occupational History  . Not on file.   Social History Main Topics  . Smoking status: Former Smoker    Packs/day: 0.50    Types: Cigarettes    Quit date: 06/24/2013  . Smokeless tobacco: Not on file  . Alcohol use No  . Drug use: No  . Sexual activity: Yes   Other Topics Concern  . Not on file   Social History  Narrative  . No narrative on file     Current Outpatient Prescriptions:  .  aspirin 81 MG tablet, Take 81 mg by mouth daily., Disp: , Rfl:  .  busPIRone (BUSPAR) 30 MG tablet, Take 30 mg by mouth 2 (two) times daily., Disp: , Rfl:  .  Cinnamon 500 MG capsule, Take 500 mg by mouth daily., Disp: , Rfl:  .  fluticasone (FLONASE) 50 MCG/ACT nasal spray, Place 2 sprays into both nostrils daily., Disp: 16 g, Rfl: 2 .  lovastatin (MEVACOR) 40 MG tablet, TAKE 1 TABLET EACH EVENING, Disp: 90 tablet, Rfl: 1 .  montelukast (SINGULAIR) 10 MG tablet, TAKE 1 TABLET EVERY DAY, Disp: 30 tablet, Rfl: 5 .  ramipril (ALTACE) 10 MG capsule, TAKE ONE CAPSULE BY MOUTH IN THE MORNING, Disp: 30 capsule, Rfl: 4  Allergies  Allergen Reactions  . Codeine Hives  . Penicillins Hives  . Krill Oil Rash     Review of Systems  Eyes: Negative for blurred vision.  Respiratory: Negative for shortness of breath.   Cardiovascular: Negative for chest pain and palpitations.  Musculoskeletal: Negative for myalgias.  Neurological: Negative for headaches.    Objective  Vitals:  01/12/16 1507  BP: 122/84  Pulse: 74  Resp: 16  Temp: 98.6 F (37 C)  TempSrc: Oral  SpO2: 99%  Weight: 185 lb 8 oz (84.1 kg)  Height: 5\' 5"  (1.651 m)    Physical Exam  Constitutional: She is well-developed, well-nourished, and in no distress.  HENT:  Mouth/Throat: No posterior oropharyngeal erythema.  Left nasal cavity inflamed and enlarged turbinate  Cardiovascular: Normal rate, regular rhythm, S1 normal and S2 normal.   No murmur heard. Pulmonary/Chest: Effort normal and breath sounds normal. She has no wheezes.  Abdominal: Soft. Bowel sounds are normal. There is no tenderness.  Psychiatric: Mood, memory, affect and judgment normal.  Nursing note and vitals reviewed.   Assessment & Plan  1. Essential hypertension  - ramipril (ALTACE) 10 MG capsule; Take 1 capsule (10 mg total) by mouth every morning.  Dispense: 90  capsule; Refill: 1  2. Hyperlipidemia, unspecified hyperlipidemia type  - lovastatin (MEVACOR) 40 MG tablet; TAKE 1 TABLET EACH EVENING  Dispense: 90 tablet; Refill: 1  3. Environmental and seasonal allergies - montelukast (SINGULAIR) 10 MG tablet; Take 1 tablet (10 mg total) by mouth daily.  Dispense: 90 tablet; Refill: 1  4. Need for influenza vaccination - Flu Vaccine QUAD 36+ mos PF IM (Fluarix & Fluzone Quad PF)   Ozzie Knobel [facility] 01/12/2016 3:14 PM

## 2016-02-05 ENCOUNTER — Other Ambulatory Visit: Payer: Self-pay | Admitting: Family Medicine

## 2016-02-17 ENCOUNTER — Encounter: Payer: Self-pay | Admitting: Family Medicine

## 2016-02-17 ENCOUNTER — Ambulatory Visit (INDEPENDENT_AMBULATORY_CARE_PROVIDER_SITE_OTHER): Payer: 59 | Admitting: Family Medicine

## 2016-02-17 DIAGNOSIS — M25512 Pain in left shoulder: Secondary | ICD-10-CM | POA: Diagnosis not present

## 2016-02-17 DIAGNOSIS — G8929 Other chronic pain: Secondary | ICD-10-CM | POA: Diagnosis not present

## 2016-02-17 DIAGNOSIS — J0100 Acute maxillary sinusitis, unspecified: Secondary | ICD-10-CM

## 2016-02-17 MED ORDER — FLUTICASONE PROPIONATE 50 MCG/ACT NA SUSP: 2.0000 | Freq: Every day | NASAL | 2 refills | Status: DC

## 2016-02-17 NOTE — Progress Notes (Signed)
Name: Morgan Gordon   MRN: [id number]    DOB: [birth year]   Date:02/17/2016       Progress Note  Subjective  Chief Complaint  Chief Complaint  Patient presents with  . Annual Exam    CPE    Sinusitis  This is a new problem. The current episode started in the past 7 days (3 days ago). There has been no fever. Associated symptoms include congestion, headaches, sinus pressure and a sore throat. Pertinent negatives include no chills, coughing or shortness of breath. Past treatments include oral decongestants (Has used Flonase and OTC decongestant). The treatment provided moderate relief.  Shoulder Pain   The pain is present in the left shoulder. This is a chronic problem. 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 The problem occurs daily. The quality of the pain is described as dull. The pain is at a severity of 5/10. Associated symptoms include joint swelling, a limited range of motion and stiffness. Pertinent negatives include no fever. She has tried NSAIDS (Aleve PM) for the symptoms. The treatment provided moderate relief.    Past Medical History:  Diagnosis Date  . Allergy    Year-round allergies and sinus infections.  . Anxiety   . Arthritis    Osteoarthritis in hips  . Bursitis    arms and neck  . Facet syndrome, lumbar    Sees Chiropractor in [place]  . History of genital warts    Had D and C  . Hyperlipidemia   . Hypertension     Past Surgical History:  Procedure Laterality Date  . TUBAL LIGATION  1997    Family History  Problem Relation Age of Onset  . Thyroid disease Mother   . Cancer Mother     breast  . Diabetes Mother   . Cancer Father     lymph node cancer  . Diabetes Father   . Diabetes Brother   . Thyroid disease Brother     Social History   Social History  . Marital status: Married    Spouse name: N/A  . Number of children: N/A  . Years of education: N/A   Occupational History   . Not on file.   Social History Main Topics  . Smoking status: Former Smoker    Packs/day: 0.50    Types: Cigarettes    Quit date: 06/24/2013  . Smokeless tobacco: Never Used  . Alcohol use No  . Drug use: No  . Sexual activity: Yes   Other Topics Concern  . Not on file   Social History Narrative  . No narrative on file     Current Outpatient Prescriptions:  .  aspirin 81 MG tablet, Take 81 mg by mouth daily., Disp: , Rfl:  .  busPIRone (BUSPAR) 30 MG tablet, Take 30 mg by mouth 2 (two) times daily., Disp: , Rfl:  .  fluticasone (FLONASE) 50 MCG/ACT nasal spray, Place 2 sprays into both nostrils daily., Disp: 16 g, Rfl: 2 .  lovastatin (MEVACOR) 40 MG tablet, TAKE 1 TABLET EACH EVENING, Disp: 90 tablet, Rfl: 1 .  montelukast (SINGULAIR) 10 MG tablet, Take 1 tablet (10 mg total) by mouth daily., Disp: 90 tablet, Rfl: 1 .  ramipril (ALTACE) 10 MG capsule, Take 1 capsule (10 mg total) by mouth every morning., Disp: 90 capsule, Rfl: 1 .  Cinnamon 500 MG capsule, Take 500 mg by mouth daily., Disp: , Rfl:   Allergies  Allergen Reactions  .  Codeine Hives  . Penicillins Hives  . Krill Oil Rash     Review of Systems  Constitutional: Negative for chills and fever.  HENT: Positive for congestion, sinus pain, sinus pressure and sore throat.   Respiratory: Negative for cough, sputum production and shortness of breath.   Cardiovascular: Negative for chest pain.  Musculoskeletal: Positive for stiffness.  Neurological: Positive for headaches.    Objective  Vitals:   02/17/16 0914  BP: 120/77  Pulse: 84  Resp: 17  Temp: 98.9 F (37.2 C)  TempSrc: Oral  SpO2: 97%  Weight: 187 lb 8 oz (85 kg)  Height: 5\' 5"  (1.651 m)    Physical Exam  Constitutional: She is oriented to person, place, and time and well-developed, well-nourished, and in no distress.  HENT:  Head: Normocephalic.  Right Ear: Tympanic membrane and ear canal normal. No drainage or swelling.  Left Ear:  Tympanic membrane and ear canal normal. No drainage or swelling.  Nose: Right sinus exhibits maxillary sinus tenderness. Left sinus exhibits maxillary sinus tenderness.  Mouth/Throat: No posterior oropharyngeal erythema.  Nasal mucosal inflammation and turbinate hypertrophy  Cardiovascular: Normal rate, regular rhythm, S1 normal and S2 normal.   No murmur heard. Pulmonary/Chest: Effort normal and breath sounds normal. She has no wheezes.  Musculoskeletal:       Left shoulder: She exhibits decreased range of motion, tenderness and pain. She exhibits no swelling.  Tenderness to palpation over the left anterior shoulder, limited ROM 2/2 pain,  Neurological: She is alert and oriented to person, place, and time.  Nursing note and vitals reviewed.    Assessment & Plan 1. Acute non-recurrent maxillary sinusitis Advised to use Flonase, rinse nose with warm water. If still persistent symptoms, call within 3 days for Abx. - fluticasone (FLONASE) 50 MCG/ACT nasal spray; Place 2 sprays into both nostrils daily.  Dispense: 16 g; Refill: 2  2. Chronic left shoulder pain Referral to orthopedics for management of chronic left shoulder pain - Ambulatory referral to Orthopedic Surgery   Jaishawn Witzke [facility] 02/17/2016 9:35 AM

## 2016-03-22 ENCOUNTER — Other Ambulatory Visit: Payer: Self-pay | Admitting: Family Medicine

## 2016-03-28 ENCOUNTER — Other Ambulatory Visit: Payer: Self-pay | Admitting: Family Medicine

## 2016-03-28 DIAGNOSIS — I10 Essential (primary) hypertension: Secondary | ICD-10-CM

## 2016-04-03 ENCOUNTER — Ambulatory Visit (INDEPENDENT_AMBULATORY_CARE_PROVIDER_SITE_OTHER): Payer: 59 | Admitting: Family Medicine

## 2016-04-03 VITALS — BP 118/78 | HR 65 | Temp 98.0°F | Resp 16 | Ht 65.0 in | Wt 194.4 lb

## 2016-04-03 DIAGNOSIS — Z0001 Encounter for general adult medical examination with abnormal findings: Secondary | ICD-10-CM

## 2016-04-03 DIAGNOSIS — Z0000 Encounter for general adult medical examination without abnormal findings: Secondary | ICD-10-CM

## 2016-04-03 DIAGNOSIS — F419 Anxiety disorder, unspecified: Secondary | ICD-10-CM | POA: Diagnosis not present

## 2016-04-03 LAB — TSH: TSH: 1.99 mIU/L

## 2016-04-03 LAB — CBC WITH DIFFERENTIAL/PLATELET
Basophils Absolute: 92 cells/uL (ref 0–200)
Basophils Relative: 1 %
Eosinophils Absolute: 184 cells/uL (ref 15–500)
Eosinophils Relative: 2 %
HCT: 44.4 % (ref 35.0–45.0)
Hemoglobin: 14.6 g/dL (ref 11.7–15.5)
Lymphocytes Relative: 21 %
Lymphs Abs: 1932 cells/uL (ref 850–3900)
MCH: 27.8 pg (ref 27.0–33.0)
MCHC: 32.9 g/dL (ref 32.0–36.0)
MCV: 84.6 fL (ref 80.0–100.0)
MPV: 8.2 fL (ref 7.5–12.5)
Monocytes Absolute: 736 cells/uL (ref 200–950)
Monocytes Relative: 8 %
Neutro Abs: 6256 cells/uL (ref 1500–7800)
Neutrophils Relative %: 68 %
Platelets: 348 10*3/uL (ref 140–400)
RBC: 5.25 MIL/uL — ABNORMAL HIGH (ref 3.80–5.10)
RDW: 14.5 % (ref 11.0–15.0)
WBC: 9.2 10*3/uL (ref 3.8–10.8)

## 2016-04-03 MED ORDER — BUSPIRONE HCL 30 MG PO TABS: 30.0000 mg | ORAL_TABLET | Freq: Two times a day (BID) | ORAL | 0 refills | Status: DC

## 2016-04-03 NOTE — Progress Notes (Signed)
Name: Morgan Gordon   MRN: [id number]    DOB: 07-22-1970   Date:04/03/2016       Progress Note  Subjective  Chief Complaint  Chief Complaint  Patient presents with  . Annual Exam    and fasting labs    Anxiety  Presents for initial visit. The problem has been unchanged. Symptoms include excessive worry, nervous/anxious behavior and panic. Patient reports no chest pain, insomnia, nausea, palpitations or shortness of breath. The severity of symptoms is moderate.   Her past medical history is significant for anxiety/panic attacks. Past treatments include non-benzodiazephine anxiolytics. Compliance with prior treatments has been good.    Pt. Is here for Complete Physical Exam, last Pap Smear in 2016 by Dr. Laurey Morale, repeat in 2019. Never had a colonoscopy.  Last mammogram was 1 year ago, repeat in 2018.   Past Medical History:  Diagnosis Date  . Allergy    Year-round allergies and sinus infections.  . Anxiety   . Arthritis    Osteoarthritis in hips  . Bursitis    arms and neck  . Facet syndrome, lumbar    Sees Chiropractor in [place]  . History of genital warts    Had D and C  . Hyperlipidemia   . Hypertension     Past Surgical History:  Procedure Laterality Date  . TUBAL LIGATION  1997    Family History  Problem Relation Age of Onset  . Thyroid disease Mother   . Cancer Mother     breast  . Diabetes Mother   . Cancer Father     lymph node cancer  . Diabetes Father   . Diabetes Brother   . Thyroid disease Brother     Social History   Social History  . Marital status: Married    Spouse name: N/A  . Number of children: N/A  . Years of education: N/A   Occupational History  . Not on file.   Social History Main Topics  . Smoking status: Former Smoker    Packs/day: 0.50    Types: Cigarettes    Quit date: 06/24/2013  . Smokeless tobacco: Never Used  . Alcohol use No  . Drug use: No  . Sexual activity: Yes   Other Topics Concern  . Not on file    Social History Narrative  . No narrative on file     Current Outpatient Prescriptions:  .  aspirin 81 MG tablet, Take 81 mg by mouth daily., Disp: , Rfl:  .  busPIRone (BUSPAR) 30 MG tablet, Take 30 mg by mouth 2 (two) times daily., Disp: , Rfl:  .  fluticasone (FLONASE) 50 MCG/ACT nasal spray, Place 2 sprays into both nostrils daily., Disp: 16 g, Rfl: 2 .  lovastatin (MEVACOR) 40 MG tablet, TAKE 1 TABLET EACH EVENING, Disp: 90 tablet, Rfl: 1 .  meloxicam (MOBIC) 15 MG tablet, TAKE 1 TABLET (15 MG TOTAL) BY MOUTH ONCE DAILY., Disp: , Rfl: 1 .  montelukast (SINGULAIR) 10 MG tablet, Take 1 tablet (10 mg total) by mouth daily., Disp: 90 tablet, Rfl: 1 .  ramipril (ALTACE) 10 MG capsule, Take 1 capsule (10 mg total) by mouth every morning., Disp: 90 capsule, Rfl: 1 .  Cinnamon 500 MG capsule, Take 500 mg by mouth daily., Disp: , Rfl:   Allergies  Allergen Reactions  . Codeine Hives  . Penicillins Hives  . Krill Oil Rash     Review of Systems  Constitutional: Negative for chills, fever, malaise/fatigue and weight loss.  HENT: Positive for congestion (frequrnt sinus congestion and allergies). Negative for ear pain, sinus pain and sore throat.   Eyes: Negative for blurred vision and double vision.  Respiratory: Negative for cough, sputum production, shortness of breath and wheezing.   Cardiovascular: Negative for chest pain, palpitations and leg swelling.  Gastrointestinal: Negative for abdominal pain, blood in stool, constipation, diarrhea, nausea and vomiting.  Genitourinary: Negative for hematuria.  Musculoskeletal: Negative for back pain, falls, joint pain and myalgias.  Neurological: Negative for headaches.  Psychiatric/Behavioral: Negative for depression. The patient is nervous/anxious. The patient does not have insomnia.       Objective  Vitals:   04/03/16 0908  BP: 118/78  Pulse: 65  Resp: 16  Temp: 98 F (36.7 C)  SpO2: 98%  Weight: 194 lb 6 oz (88.2 kg)   Height: 5\' 5"  (1.651 m)    Physical Exam  Constitutional: She is oriented to person, place, and time and well-developed, well-nourished, and in no distress.  HENT:  Head: Normocephalic and atraumatic.  Eyes: Pupils are equal, round, and reactive to light.  Neck: Neck supple.  Cardiovascular: Normal rate, regular rhythm and normal heart sounds.   No murmur heard. Pulmonary/Chest: Effort normal and breath sounds normal. She has no wheezes.  Abdominal: Soft. Bowel sounds are normal. There is no tenderness.  Neurological: She is alert and oriented to person, place, and time.  Skin: Skin is warm, dry and intact.  Psychiatric: Mood, memory, affect and judgment normal.  Nursing note and vitals reviewed.     Assessment & Plan  1. Well woman exam without gynecological exam Routine age appropriate lab work obtained - CBC with Differential - TSH - Vitamin D (25 hydroxy)  2. Anxiety Stable and responsive to BuSpar taken twice daily, refills provided - busPIRone (BUSPAR) 30 MG tablet; Take 1 tablet (30 mg total) by mouth 2 (two) times daily.  Dispense: 180 tablet; Refill: 0   Barbarajean Kinzler [facility] 04/03/2016 9:15 AM

## 2016-04-04 LAB — VITAMIN D 25 HYDROXY (VIT D DEFICIENCY, FRACTURES): Vit D, 25-Hydroxy: 35 ng/mL (ref 30–100)

## 2016-05-04 ENCOUNTER — Other Ambulatory Visit: Payer: Self-pay | Admitting: Family Medicine

## 2016-05-04 DIAGNOSIS — I10 Essential (primary) hypertension: Secondary | ICD-10-CM

## 2016-06-08 ENCOUNTER — Other Ambulatory Visit: Payer: Self-pay | Admitting: Family Medicine

## 2016-06-08 DIAGNOSIS — J0100 Acute maxillary sinusitis, unspecified: Secondary | ICD-10-CM

## 2016-06-22 ENCOUNTER — Other Ambulatory Visit: Payer: Self-pay | Admitting: Family Medicine

## 2016-06-22 DIAGNOSIS — F419 Anxiety disorder, unspecified: Secondary | ICD-10-CM

## 2016-06-22 DIAGNOSIS — J3089 Other allergic rhinitis: Secondary | ICD-10-CM

## 2016-06-22 NOTE — Telephone Encounter (Signed)
Last note reviewed in regards to buspar; Rx approved

## 2016-06-26 ENCOUNTER — Other Ambulatory Visit: Payer: Self-pay | Admitting: Family Medicine

## 2016-06-26 DIAGNOSIS — I10 Essential (primary) hypertension: Secondary | ICD-10-CM

## 2016-07-14 ENCOUNTER — Ambulatory Visit
Admission: EM | Admit: 2016-07-14 | Discharge: 2016-07-14 | Disposition: A | Discharge status: Home / Self Care | Payer: BLUE CROSS/BLUE SHIELD | Attending: Emergency Medicine | Admitting: Emergency Medicine

## 2016-07-14 ENCOUNTER — Encounter: Payer: Self-pay | Admitting: Gynecology

## 2016-07-14 DIAGNOSIS — J0110 Acute frontal sinusitis, unspecified: Secondary | ICD-10-CM

## 2016-07-14 MED ORDER — PREDNISONE 50 MG PO TABS: 50.0000 mg | ORAL_TABLET | Freq: Every day | ORAL | 0 refills | Status: DC

## 2016-07-14 NOTE — Discharge Instructions (Signed)
Take the medication as written. Try the prednisone, Mucinex or Mucinex D, whatever your blood pressure will tolerate. Make sure you continue taking her Singulair but discontinue the other allergy pill. You may take 600 mg of motrin with 1 gram of tylenol up to 3-4 times a day as needed for pain. This is an effective combination for pain.  Most sinus infections are viral and do not need antibiotics unless you have a high fever, have had this for 10 days, or you get better and then get sick again.   Go to www.goodrx.com to look up your medications. This will give you a list of where you can find your prescriptions at the most affordable prices.

## 2016-07-14 NOTE — ED Triage Notes (Signed)
Patient c/o sinus congestion / cough / chest congestion x 5 days.

## 2016-07-14 NOTE — ED Provider Notes (Signed)
HPI  SUBJECTIVE:  Morgan Gordon is a 46 y.o. female who presents with sinus congestion, thick light yellow nasal congestion, rhinorrhea, postnasal drip, sinus pressure, nonproductive "burning" cough and irritated sore throat for the past 5 days. she states that her allergies have been bothering her recently She continues to take her allergy pill daily without any improvement. She is using some Flonase. She also tried Mucinex, and allergy pill, DayQuil, NyQuil , Vicks VapoRub. DayQuil and VapoRub seemed to help. Symptoms are worse with lying down as it makes her nasal congestion worse She denies upper dental pain, facial swelling, wheezing, chest pain, shortness of breath. She reports 6 contacts with similar symptoms.  No antibiotic in the past month. No antipyretic in the past 6-8 hours. Patient isn't able to sleep through the night without waking up coughing. She has past medical history of hypertension for which she takes an ACE inhibitor, allergies. She is a former smoker quit 3 years ago. No history of lung disease, diabetes. She has a past medical history sinusitis and states that this feels similar to this. [id number] Manuella Ghazi, MD  Past Medical History:  Diagnosis Date  . Allergy    Year-round allergies and sinus infections.  . Anxiety   . Arthritis    Osteoarthritis in hips  . Bursitis    arms and neck  . Facet syndrome, lumbar    Sees Chiropractor in [place]  . History of genital warts    Had D and C  . Hyperlipidemia   . Hypertension     Past Surgical History:  Procedure Laterality Date  . TUBAL LIGATION  1997    Family History  Problem Relation Age of Onset  . Thyroid disease Mother   . Cancer Mother     breast  . Diabetes Mother   . Cancer Father     lymph node cancer  . Diabetes Father   . Diabetes Brother   . Thyroid disease Brother     Social History  Substance Use Topics  . Smoking status: Former Smoker    Packs/day: 0.50    Types: Cigarettes    Quit date:  06/24/2013  . Smokeless tobacco: Never Used  . Alcohol use No    No current facility-administered medications for this encounter.   Current Outpatient Prescriptions:  .  aspirin 81 MG tablet, Take 81 mg by mouth daily., Disp: , Rfl:  .  busPIRone (BUSPAR) 30 MG tablet, TAKE 1 TABLET (30 MG TOTAL) BY MOUTH 2 (TWO) TIMES DAILY., Disp: 180 tablet, Rfl: 0 .  Cinnamon 500 MG capsule, Take 500 mg by mouth daily., Disp: , Rfl:  .  fluticasone (FLONASE) 50 MCG/ACT nasal spray, PLACE 2 SPRAYS INTO BOTH NOSTRILS DAILY., Disp: 16 g, Rfl: 2 .  lovastatin (MEVACOR) 40 MG tablet, TAKE 1 TABLET EACH EVENING, Disp: 90 tablet, Rfl: 1 .  meloxicam (MOBIC) 15 MG tablet, TAKE 1 TABLET (15 MG TOTAL) BY MOUTH ONCE DAILY., Disp: , Rfl: 1 .  montelukast (SINGULAIR) 10 MG tablet, TAKE 1 TABLET (10 MG TOTAL) BY MOUTH DAILY., Disp: 90 tablet, Rfl: 0 .  ramipril (ALTACE) 10 MG capsule, TAKE ONE CAPSULE BY MOUTH IN THE MORNING, Disp: 30 capsule, Rfl: 1 .  predniSONE (DELTASONE) 50 MG tablet, Take 1 tablet (50 mg total) by mouth daily with breakfast., Disp: 5 tablet, Rfl: 0  Allergies  Allergen Reactions  . Codeine Hives  . Penicillins Hives  . Krill Oil Rash     ROS  As noted  in HPI.   Physical Exam  BP 130/80 (BP Location: Left Arm)   Pulse 76   Temp 98.2 F (36.8 C) (Oral)   Resp 16   Ht 5\' 4"  (1.626 m)   Wt 185 lb (83.9 kg)   LMP 07/11/2016   SpO2 99%   BMI 31.76 kg/m   Constitutional: Well developed, well nourished, no acute distress Eyes:  EOMI, conjunctiva normal bilaterally HENT: Normocephalic, atraumatic,mucus membranes moist. TMs normal bilaterally. +  clear scant nasal congestion.  red turbinates. No swelling - maxillary sinus tenderness, + L frontal sinus tenderness. Oropharynx normal  - postnasal drip.  Respiratory: Normal inspiratory effort Lungs clear bilaterally Cardiovascular: Normal rate GI: nondistended skin: No rash, skin intact Musculoskeletal: no deformities Neurologic:  Alert & oriented x 3, no focal neuro deficits Psychiatric: Speech and behavior appropriate   ED Course   Medications - No data to display  No orders of the defined types were placed in this encounter.   No results found for this or any previous visit (from the past 24 hour(s)). No results found.  ED Clinical Impression  Acute non-recurrent frontal sinusitis   ED Assessment/Plan  Discussed the patient that this is most likely viral at this point in time, we'll have her discontinue the allergy pill and have her start Mucinex or Mucinex D whatever blood pressure will tolerate. She is unable to tolerate saline nasal irrigation, as it "irritates my sinuses"  we'll have her continue her Flonase. Start prednisone 50 mg for 5 days and sending home with a wait-and-see prescription of doxycycline. She will follow up with the primary care physician as needed. Also providing 3 day work note.   *This clinic note was created using Dragon dictation software. Therefore, there may be occasional mistakes despite careful proofreading.  ?    Melynda Ripple, MD 07/14/16 [phone]

## 2016-08-09 ENCOUNTER — Encounter: Payer: Self-pay | Admitting: Family Medicine

## 2016-08-09 ENCOUNTER — Ambulatory Visit (INDEPENDENT_AMBULATORY_CARE_PROVIDER_SITE_OTHER): Payer: BLUE CROSS/BLUE SHIELD | Admitting: Family Medicine

## 2016-08-09 VITALS — BP 128/70 | HR 70 | Temp 98.0°F | Resp 16 | Ht 64.0 in | Wt 199.3 lb

## 2016-08-09 DIAGNOSIS — E669 Obesity, unspecified: Secondary | ICD-10-CM

## 2016-08-09 DIAGNOSIS — E7800 Pure hypercholesterolemia, unspecified: Secondary | ICD-10-CM | POA: Diagnosis not present

## 2016-08-09 DIAGNOSIS — R7303 Prediabetes: Secondary | ICD-10-CM | POA: Diagnosis not present

## 2016-08-09 DIAGNOSIS — I10 Essential (primary) hypertension: Secondary | ICD-10-CM

## 2016-08-09 LAB — POCT GLYCOSYLATED HEMOGLOBIN (HGB A1C): Hemoglobin A1C: 6

## 2016-08-09 MED ORDER — LORCASERIN HCL ER 20 MG PO TB24: 1.0000 | ORAL_TABLET | Freq: Every day | ORAL | 2 refills | Status: DC

## 2016-08-09 NOTE — Progress Notes (Signed)
Name: Morgan Gordon   MRN: [id number]    DOB: Sep 06, 1970   Date:08/09/2016       Progress Note  Subjective  Chief Complaint  Chief Complaint  Patient presents with  . Follow-up    Hyperlipidemia  This is a chronic problem. The problem is controlled. Recent lipid tests were reviewed and are normal. Pertinent negatives include no chest pain, leg pain, myalgias or shortness of breath. Current antihyperlipidemic treatment includes statins. Risk factors for coronary artery disease include dyslipidemia.  Hypertension  This is a chronic problem. The problem is unchanged. The problem is controlled. Pertinent negatives include no blurred vision, chest pain, headaches, palpitations or shortness of breath. Past treatments include ACE inhibitors.   Obesity:  Pt. Is considered obese wit BMI of 34.21, she has just started a diet program of low carbs and sugar and has started workouts in [employer]. She feels that she struggles with portion control, she likes to eat everything that she puts on her plate and would like help reducing her portion and subsequent caloric intake   Past Medical History:  Diagnosis Date  . Allergy    Year-round allergies and sinus infections.  . Anxiety   . Arthritis    Osteoarthritis in hips  . Bursitis    arms and neck  . Facet syndrome, lumbar ([place])    Sees Chiropractor in [place]  . History of genital warts    Had D and C  . Hyperlipidemia   . Hypertension     Past Surgical History:  Procedure Laterality Date  . TUBAL LIGATION  1997    Family History  Problem Relation Age of Onset  . Thyroid disease Mother   . Cancer Mother     breast  . Diabetes Mother   . Cancer Father     lymph node cancer  . Diabetes Father   . Diabetes Brother   . Thyroid disease Brother     Social History   Social History  . Marital status: Married    Spouse name: N/A  . Number of children: N/A  . Years of education: N/A   Occupational History  . Not on  file.   Social History Main Topics  . Smoking status: Former Smoker    Packs/day: 0.50    Types: Cigarettes    Quit date: 06/24/2013  . Smokeless tobacco: Never Used  . Alcohol use No  . Drug use: No  . Sexual activity: Yes   Other Topics Concern  . Not on file   Social History Narrative  . No narrative on file     Current Outpatient Prescriptions:  .  aspirin 81 MG tablet, Take 81 mg by mouth daily., Disp: , Rfl:  .  busPIRone (BUSPAR) 30 MG tablet, TAKE 1 TABLET (30 MG TOTAL) BY MOUTH 2 (TWO) TIMES DAILY., Disp: 180 tablet, Rfl: 0 .  fluticasone (FLONASE) 50 MCG/ACT nasal spray, PLACE 2 SPRAYS INTO BOTH NOSTRILS DAILY., Disp: 16 g, Rfl: 2 .  lovastatin (MEVACOR) 40 MG tablet, TAKE 1 TABLET EACH EVENING, Disp: 90 tablet, Rfl: 1 .  meloxicam (MOBIC) 15 MG tablet, TAKE 1 TABLET (15 MG TOTAL) BY MOUTH ONCE DAILY., Disp: , Rfl: 1 .  montelukast (SINGULAIR) 10 MG tablet, TAKE 1 TABLET (10 MG TOTAL) BY MOUTH DAILY., Disp: 90 tablet, Rfl: 0 .  ramipril (ALTACE) 10 MG capsule, TAKE ONE CAPSULE BY MOUTH IN THE MORNING, Disp: 30 capsule, Rfl: 1  Allergies  Allergen Reactions  . Codeine Hives  .  Penicillins Hives  . Krill Oil Rash     Review of Systems  Eyes: Negative for blurred vision.  Respiratory: Negative for shortness of breath.   Cardiovascular: Negative for chest pain and palpitations.  Musculoskeletal: Negative for myalgias.  Neurological: Negative for headaches.     Objective  Vitals:   08/09/16 1030  BP: 128/70  Pulse: 70  Resp: 16  Temp: 98 F (36.7 C)  TempSrc: Oral  SpO2: 98%  Weight: 199 lb 4.8 oz (90.4 kg)  Height: 5\' 4"  (1.626 m)    Physical Exam  Constitutional: She is oriented to person, place, and time and well-developed, well-nourished, and in no distress.  HENT:  Head: Normocephalic and atraumatic.  Neck: Neck supple. No thyromegaly present.  Cardiovascular: Normal rate, regular rhythm and normal heart sounds.   No murmur  heard. Pulmonary/Chest: Effort normal and breath sounds normal. She has no wheezes.  Neurological: She is alert and oriented to person, place, and time.  Psychiatric: Mood, memory, affect and judgment normal.  Nursing note and vitals reviewed.     Assessment & Plan  1. Essential hypertension BP stable on present anti- hypertensive therapy  2. Pure hypercholesterolemia  - Lipid panel - COMPLETE METABOLIC PANEL WITH GFR  3. Obesity (BMI 30.0-34.9) Started on Belviq XR 20 mg daily for management of obesity, advised to continue with dietary and lifestyle changes, follow-up in 3 months - Lorcaserin HCl ER (BELVIQ XR) 20 MG TB24; Take 1 tablet by mouth daily.  Dispense: 30 tablet; Refill: 2  4. Prediabetes Point-of-care A1c 6.0%, relatively unchanged from last year - POCT glycosylated hemoglobin (Hb A1C)   Morgan Gordon [facility] 08/09/2016 10:44 AM

## 2016-08-13 ENCOUNTER — Emergency Department
Admission: EM | Admit: 2016-08-13 | Discharge: 2016-08-14 | Disposition: A | Discharge status: Home / Self Care | Payer: BLUE CROSS/BLUE SHIELD | Attending: Emergency Medicine | Admitting: Emergency Medicine

## 2016-08-13 ENCOUNTER — Ambulatory Visit (INDEPENDENT_AMBULATORY_CARE_PROVIDER_SITE_OTHER): Payer: BLUE CROSS/BLUE SHIELD

## 2016-08-13 ENCOUNTER — Ambulatory Visit (INDEPENDENT_AMBULATORY_CARE_PROVIDER_SITE_OTHER)
Admission: EM | Admit: 2016-08-13 | Discharge: 2016-08-13 | Disposition: A | Discharge status: Home / Self Care | Payer: BLUE CROSS/BLUE SHIELD | Source: Home / Self Care | Attending: Family Medicine | Admitting: Family Medicine

## 2016-08-13 DIAGNOSIS — E7800 Pure hypercholesterolemia, unspecified: Secondary | ICD-10-CM | POA: Insufficient documentation

## 2016-08-13 DIAGNOSIS — E669 Obesity, unspecified: Secondary | ICD-10-CM | POA: Insufficient documentation

## 2016-08-13 DIAGNOSIS — R0602 Shortness of breath: Secondary | ICD-10-CM

## 2016-08-13 DIAGNOSIS — J302 Other seasonal allergic rhinitis: Secondary | ICD-10-CM | POA: Insufficient documentation

## 2016-08-13 DIAGNOSIS — F419 Anxiety disorder, unspecified: Secondary | ICD-10-CM | POA: Insufficient documentation

## 2016-08-13 DIAGNOSIS — Z79899 Other long term (current) drug therapy: Secondary | ICD-10-CM | POA: Diagnosis not present

## 2016-08-13 DIAGNOSIS — I10 Essential (primary) hypertension: Secondary | ICD-10-CM | POA: Insufficient documentation

## 2016-08-13 DIAGNOSIS — R7303 Prediabetes: Secondary | ICD-10-CM | POA: Insufficient documentation

## 2016-08-13 DIAGNOSIS — R51 Headache: Secondary | ICD-10-CM | POA: Insufficient documentation

## 2016-08-13 DIAGNOSIS — R079 Chest pain, unspecified: Secondary | ICD-10-CM | POA: Diagnosis not present

## 2016-08-13 DIAGNOSIS — Z7982 Long term (current) use of aspirin: Secondary | ICD-10-CM | POA: Insufficient documentation

## 2016-08-13 DIAGNOSIS — Z87891 Personal history of nicotine dependence: Secondary | ICD-10-CM | POA: Insufficient documentation

## 2016-08-13 DIAGNOSIS — R0789 Other chest pain: Secondary | ICD-10-CM

## 2016-08-13 DIAGNOSIS — M25512 Pain in left shoulder: Secondary | ICD-10-CM | POA: Insufficient documentation

## 2016-08-13 DIAGNOSIS — M160 Bilateral primary osteoarthritis of hip: Secondary | ICD-10-CM | POA: Insufficient documentation

## 2016-08-13 DIAGNOSIS — M542 Cervicalgia: Secondary | ICD-10-CM | POA: Diagnosis not present

## 2016-08-13 DIAGNOSIS — M25511 Pain in right shoulder: Secondary | ICD-10-CM | POA: Insufficient documentation

## 2016-08-13 DIAGNOSIS — E785 Hyperlipidemia, unspecified: Secondary | ICD-10-CM | POA: Insufficient documentation

## 2016-08-13 DIAGNOSIS — G8929 Other chronic pain: Secondary | ICD-10-CM | POA: Insufficient documentation

## 2016-08-13 LAB — CBC
HCT: 44.3 % (ref 35.0–47.0)
Hemoglobin: 14.8 g/dL (ref 12.0–16.0)
MCH: 27.4 pg (ref 26.0–34.0)
MCHC: 33.4 g/dL (ref 32.0–36.0)
MCV: 82 fL (ref 80.0–100.0)
Platelets: 366 10*3/uL (ref 150–440)
RBC: 5.4 MIL/uL — ABNORMAL HIGH (ref 3.80–5.20)
RDW: 15 % — ABNORMAL HIGH (ref 11.5–14.5)
WBC: 14.3 10*3/uL — ABNORMAL HIGH (ref 3.6–11.0)

## 2016-08-13 LAB — FIBRIN DERIVATIVES D-DIMER (ARMC ONLY): Fibrin derivatives D-dimer (ARMC): 296.03 (ref 0.00–499.00)

## 2016-08-13 LAB — BASIC METABOLIC PANEL
Anion gap: 7 (ref 5–15)
BUN: 11 mg/dL (ref 6–20)
CO2: 25 mmol/L (ref 22–32)
Calcium: 10.2 mg/dL (ref 8.9–10.3)
Chloride: 104 mmol/L (ref 101–111)
Creatinine, Ser: 0.84 mg/dL (ref 0.44–1.00)
GFR calc Af Amer: >60 mL/min (ref >60)
GFR calc non Af Amer: >60 mL/min (ref >60)
Glucose, Bld: 119 mg/dL — ABNORMAL HIGH (ref 65–99)
Potassium: 3.9 mmol/L (ref 3.5–5.1)
Sodium: 136 mmol/L (ref 135–145)

## 2016-08-13 LAB — TROPONIN I
Troponin I: <0.03 ng/mL (ref <0.03)
Troponin I: <0.03 ng/mL (ref <0.03)

## 2016-08-13 NOTE — ED Provider Notes (Signed)
[facility]    CSN: [id number] Arrival date & time: 08/13/16  0910     History   Chief Complaint Chief Complaint  Patient presents with  . Shortness of Breath  . Chest Pain    HPI Morgan Gordon is a 46 y.o. female.    Shortness of Breath  Severity:  Mild Onset quality:  Gradual Duration:  3 days Timing:  Intermittent Chronicity:  New Associated symptoms: chest pain   Associated symptoms: no abdominal pain, no claudication, no cough, no diaphoresis, no fever, no headaches, no PND, no syncope and no vomiting   Chest Pain  Pain location:  Substernal area Pain quality: pressure   Radiates to: throat. Pain severity:  Moderate Onset quality:  Gradual Duration:  3 days Timing:  Constant Progression:  Unchanged Chronicity:  New Context: breathing and movement   Context: not drug use, not eating, not lifting, not raising an arm, not at rest, not stress and not trauma   Relieved by:  Rest Worsened by:  Movement, deep breathing and certain positions Associated symptoms: anxiety and shortness of breath   Associated symptoms: no abdominal pain, no AICD problem, no altered mental status, no anorexia, no back pain, no claudication, no cough, no diaphoresis, no dizziness, no dysphagia, no fatigue, no fever, no headache, no heartburn, no lower extremity edema, no nausea, no near-syncope, no numbness, no orthopnea, no palpitations, no PND, no syncope, no vomiting and no weakness   Risk factors: high cholesterol, hypertension and obesity   Risk factors: no aortic disease, no birth control, no coronary artery disease, no diabetes mellitus, no Ehlers-Danlos syndrome, no immobilization, not female, no Marfan's syndrome, not pregnant, no prior DVT/PE, no smoking and no surgery     Past Medical History:  Diagnosis Date  . Allergy    Year-round allergies and sinus infections.  . Anxiety   . Arthritis    Osteoarthritis in hips  . Bursitis    arms and neck  . 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 History of genital warts    Had D and C  . Hyperlipidemia   . Hypertension     Patient Active Problem List   Diagnosis Date Noted  . Prediabetes 08/09/2016  . Well woman exam without gynecological exam 04/03/2016  . Chronic left shoulder pain 02/17/2016  . Chronic right shoulder pain 08/30/2015  . Family history of diabetes mellitus in mother 06/02/2015  . Obesity 05/10/2015  . Hypertension 04/26/2015  . Arthritis 04/26/2015  . Anxiety 04/26/2015  . Hyperlipidemia 04/26/2015  . Environmental and seasonal allergies 04/26/2015    Past Surgical History:  Procedure Laterality Date  . TUBAL LIGATION  1997    OB History    No data available       Home Medications    Prior to Admission medications   Medication Sig Start Date End Date Taking? Authorizing Provider  aspirin 81 MG tablet Take 81 mg by mouth daily.   Yes [provider]  busPIRone (BUSPAR) 30 MG tablet TAKE 1 TABLET (30 MG TOTAL) BY MOUTH 2 (TWO) TIMES DAILY. 06/22/16  Yes Lada, Satira Anis, MD  fluticasone (FLONASE) 50 MCG/ACT nasal spray PLACE 2 SPRAYS INTO BOTH NOSTRILS DAILY. 06/10/16  Yes Roselee Nova, MD  lovastatin (MEVACOR) 40 MG tablet TAKE 1 TABLET EACH EVENING 03/22/16  Yes Keith Rake Asad A, MD  meloxicam (MOBIC) 15 MG tablet TAKE 1 TABLET (15 MG TOTAL) BY MOUTH  ONCE DAILY. 03/21/16  Yes [provider]  montelukast (SINGULAIR) 10 MG tablet TAKE 1 TABLET (10 MG TOTAL) BY MOUTH DAILY. 06/22/16  Yes Lada, Satira Anis, MD  ramipril (ALTACE) 10 MG capsule TAKE ONE CAPSULE BY MOUTH IN THE MORNING 06/26/16  Yes Roselee Nova, MD  Lorcaserin HCl ER (BELVIQ XR) 20 MG TB24 Take 1 tablet by mouth daily. 08/09/16   Roselee Nova, MD    Family History Family History  Problem Relation Age of Onset  . Thyroid disease Mother   . Cancer Mother     breast  . Diabetes Mother   . Cancer Father     lymph node cancer  . Diabetes Father     . Diabetes Brother   . Thyroid disease Brother     Social History Social History  Substance Use Topics  . Smoking status: Former Smoker    Packs/day: 0.50    Types: Cigarettes    Quit date: 06/24/2013  . Smokeless tobacco: Never Used  . Alcohol use No     Allergies   Codeine; Penicillins; and Krill oil   Review of Systems Review of Systems  Constitutional: Negative for diaphoresis, fatigue and fever.  HENT: Negative for trouble swallowing.   Respiratory: Positive for shortness of breath. Negative for cough.   Cardiovascular: Positive for chest pain. Negative for palpitations, orthopnea, claudication, syncope, PND and near-syncope.  Gastrointestinal: Negative for abdominal pain, anorexia, heartburn, nausea and vomiting.  Musculoskeletal: Negative for back pain.  Neurological: Negative for dizziness, weakness, numbness and headaches.     Physical Exam Triage Vital Signs ED Triage Vitals [08/13/16 0927]  Enc Vitals Group     BP 138/79     Pulse Rate 86     Resp 16     Temp 98 F (36.7 C)     Temp Source Oral     SpO2 98 %     Weight 199 lb 6.4 oz (90.4 kg)     Height 5\' 4"  (1.626 m)     Head Circumference      Peak Flow      Pain Score 6     Pain Loc      Pain Edu?      Excl. in [place]?    No data found.   Updated Vital Signs BP 138/79 (BP Location: Left Arm)   Pulse 86   Temp 98 F (36.7 C) (Oral)   Resp 16   Ht 5\' 4"  (1.626 m)   Wt 199 lb 6.4 oz (90.4 kg)   LMP 07/10/2016 (Within Days) Comment: deneis preg  SpO2 98%   BMI 34.23 kg/m   Visual Acuity Right Eye Distance:   Left Eye Distance:   Bilateral Distance:    Right Eye Near:   Left Eye Near:    Bilateral Near:     Physical Exam  Constitutional: She appears well-developed and well-nourished. No distress.  HENT:  Head: Normocephalic and atraumatic.  Right Ear: External ear normal.  Left Ear: External ear normal.  Nose: No mucosal edema, rhinorrhea, nose lacerations, sinus tenderness,  nasal deformity, septal deviation or nasal septal hematoma. No epistaxis.  No foreign bodies. Right sinus exhibits no maxillary sinus tenderness and no frontal sinus tenderness. Left sinus exhibits no maxillary sinus tenderness and no frontal sinus tenderness.  Mouth/Throat: Uvula is midline, oropharynx is clear and moist and mucous membranes are normal. No oropharyngeal exudate.  Eyes: Conjunctivae and EOM are normal. Pupils are equal, round,  and reactive to light. Right eye exhibits no discharge. Left eye exhibits no discharge. No scleral icterus.  Neck: Normal range of motion. Neck supple. No thyromegaly present.  Cardiovascular: Normal rate, regular rhythm and normal heart sounds.   Pulmonary/Chest: Effort normal and breath sounds normal. No respiratory distress. She has no wheezes. She has no rales.  Abdominal: Soft. Bowel sounds are normal. She exhibits no distension and no mass. There is no tenderness. There is no rebound and no guarding. No hernia.  Musculoskeletal: She exhibits no edema or tenderness.  Lymphadenopathy:    She has no cervical adenopathy.  Skin: Skin is warm and dry. She is not diaphoretic. No erythema.  Nursing note and vitals reviewed.    UC Treatments / Results  Labs (all labs ordered are listed, but only abnormal results are displayed) Labs Reviewed  FIBRIN DERIVATIVES D-DIMER ([facility] ONLY)  TROPONIN I    EKG  EKG Interpretation None       Radiology Dg Chest 2 View  Result Date: 08/13/2016 CLINICAL DATA:  46 year old female with history of upper anterior chest pain radiating into the neck and intermittent airway tightening getting worse during exertion for the past 3 days. Headache. EXAM: CHEST  2 VIEW COMPARISON:  No priors. FINDINGS: Lung volumes are normal. No consolidative airspace disease. No pleural effusions. No pneumothorax. No pulmonary nodule or mass noted. Pulmonary vasculature and the cardiomediastinal silhouette are within normal limits.  IMPRESSION: No radiographic evidence of acute cardiopulmonary disease. Electronically Signed   By: Vinnie Langton M.D.   On: 08/13/2016 10:14    Procedures ED EKG Date/Time: 08/13/2016 11:50 AM Performed by: Norval Gable Authorized by: Norval Gable   ECG reviewed by ED Physician in the absence of a cardiologist: yes   Previous ECG:    Previous ECG:  Unavailable Interpretation:    Interpretation: normal   Rate:    ECG rate:  81   ECG rate assessment: normal   Rhythm:    Rhythm: sinus rhythm   Ectopy:    Ectopy: none   QRS:    QRS axis:  Normal Conduction:    Conduction: normal   ST segments:    ST segments:  Normal T waves:    T waves: normal     (including critical care time)  Medications Ordered in UC Medications - No data to display   Initial Impression / Assessment and Plan / UC Course  I have reviewed the triage vital signs and the nursing notes.  Pertinent labs & imaging results that were available during my care of the patient were reviewed by me and considered in my medical decision making (see chart for details).      Final Clinical Impressions(s) / UC Diagnoses   Final diagnoses:  Atypical chest pain    New Prescriptions Discharge Medication List as of 08/13/2016 11:14 AM     1. Labs/x-ray/ekg results (normal/negative) and diagnosis reviewed with patient 2. Recommend continue daily aspirin 3. Recommended follow up with PCP and/or cardiologist this week for further evaluation and management; if symptoms worsen, go to ED   Norval Gable, MD 08/13/16 1153

## 2016-08-13 NOTE — ED Triage Notes (Signed)
Patient complains of shortness of breath, chest pressure, headaches, chest pain that radiates up in to throat. Patient states that symptoms started 3 days ago and have been worsening.

## 2016-08-13 NOTE — Discharge Instructions (Signed)
Follow up with Primary Care Provider and/or cardiologist this week

## 2016-08-13 NOTE — ED Triage Notes (Signed)
Pt in with co chest pain since Friday states radiates to throat, went to urgent care and had ekg and blood work done that were all normal. Does have shob at times with sweatiness. States pain worse when she lays down, no hx of heart disease.

## 2016-08-13 NOTE — ED Notes (Signed)
EKG was performed at 2036 and is on paper chart

## 2016-08-14 ENCOUNTER — Emergency Department: Payer: BLUE CROSS/BLUE SHIELD

## 2016-08-14 ENCOUNTER — Encounter: Payer: Self-pay | Admitting: Radiology

## 2016-08-14 DIAGNOSIS — M542 Cervicalgia: Secondary | ICD-10-CM | POA: Diagnosis not present

## 2016-08-14 DIAGNOSIS — R51 Headache: Secondary | ICD-10-CM | POA: Diagnosis not present

## 2016-08-14 MED ORDER — MORPHINE SULFATE (PF) 4 MG/ML IV SOLN
4.0000 mg | Freq: Once | INTRAVENOUS | Status: AC
  Administered 2016-08-14: 4 mg via INTRAVENOUS
  Filled 2016-08-14: qty 1

## 2016-08-14 MED ORDER — IOPAMIDOL (ISOVUE-370) INJECTION 76%
75.0000 mL | Freq: Once | INTRAVENOUS | Status: AC | PRN
  Administered 2016-08-14: 75 mL via INTRAVENOUS

## 2016-08-14 MED ORDER — ONDANSETRON HCL 4 MG/2ML IJ SOLN
4.0000 mg | Freq: Once | INTRAMUSCULAR | Status: AC
  Administered 2016-08-14: 4 mg via INTRAVENOUS
  Filled 2016-08-14: qty 2

## 2016-08-14 NOTE — ED Provider Notes (Signed)
[facility] Emergency Department Provider Note    First MD Initiated Contact with Patient 08/13/16 2338     (approximate)  I have reviewed the triage vital signs and the nursing notes.   HISTORY  Chief Complaint Chest Pain    HPI Morgan Gordon is a 46 y.o. female with below list of chronic medical conditions presents with four-day history of persistent headache neck pain upper chest discomfort. Patient states her current pain score 7 out of 10. Patient states pain is worse with laying flat. Patient denies any personal history of cardiac disease. However patient states multiple family members with CAD including parents and siblings. Patient denies any weakness numbness gait instability or visual changes.   Past Medical History:  Diagnosis Date  . Allergy    Year-round allergies and sinus infections.  . Anxiety   . Arthritis    Osteoarthritis in hips  . Bursitis    arms and neck  . Facet syndrome, lumbar ([place])    Sees Chiropractor in [place]  . History of genital warts    Had D and C  . Hyperlipidemia   . Hypertension     Patient Active Problem List   Diagnosis Date Noted  . Prediabetes 08/09/2016  . Well woman exam without gynecological exam 04/03/2016  . Chronic left shoulder pain 02/17/2016  . Chronic right shoulder pain 08/30/2015  . Family history of diabetes mellitus in mother 06/02/2015  . Obesity 05/10/2015  . Hypertension 04/26/2015  . Arthritis 04/26/2015  . Anxiety 04/26/2015  . Hyperlipidemia 04/26/2015  . Environmental and seasonal allergies 04/26/2015    Past Surgical History:  Procedure Laterality Date  . TUBAL LIGATION  1997    Prior to Admission medications   Medication Sig Start Date End Date Taking? Authorizing Provider  aspirin 81 MG tablet Take 81 mg by mouth daily.   Yes [provider]  busPIRone (BUSPAR) 30 MG tablet TAKE 1 TABLET (30 MG TOTAL) BY MOUTH 2 (TWO) TIMES DAILY. 06/22/16  Yes Lada,  Satira Anis, MD  fluticasone (FLONASE) 50 MCG/ACT nasal spray PLACE 2 SPRAYS INTO BOTH NOSTRILS DAILY. 06/10/16  Yes Roselee Nova, MD  lovastatin (MEVACOR) 40 MG tablet TAKE 1 TABLET EACH EVENING 03/22/16  Yes Keith Rake Asad A, MD  meloxicam (MOBIC) 15 MG tablet TAKE 1 TABLET (15 MG TOTAL) BY MOUTH ONCE DAILY. 03/21/16  Yes [provider]  montelukast (SINGULAIR) 10 MG tablet TAKE 1 TABLET (10 MG TOTAL) BY MOUTH DAILY. 06/22/16  Yes Lada, Satira Anis, MD  ramipril (ALTACE) 10 MG capsule TAKE ONE CAPSULE BY MOUTH IN THE MORNING 06/26/16  Yes Roselee Nova, MD    Allergies Codeine; Penicillins; and Krill oil  Family History  Problem Relation Age of Onset  . Thyroid disease Mother   . Cancer Mother     breast  . Diabetes Mother   . Cancer Father     lymph node cancer  . Diabetes Father   . Diabetes Brother   . Thyroid disease Brother     Social History Social History  Substance Use Topics  . Smoking status: Former Smoker    Packs/day: 0.50    Types: Cigarettes    Quit date: 06/24/2013  . Smokeless tobacco: Never Used  . Alcohol use No    Review of Systems Constitutional: No fever/chills Eyes: No visual changes. ENT: No sore throat.Positive for neck pain Cardiovascular: Positive for chest pain. Respiratory: Denies shortness of breath. Gastrointestinal: No  abdominal pain.  No nausea, no vomiting.  No diarrhea.  No constipation. Genitourinary: Negative for dysuria. Musculoskeletal: Negative for back pain. Integumentary: Negative for rash. Neurological: Positive for headaches, negative for focal weakness or numbness.   ____________________________________________   PHYSICAL EXAM:  VITAL SIGNS: ED Triage Vitals  Enc Vitals Group     BP 08/13/16 2030 (!) 162/96     Pulse Rate 08/13/16 2030 84     Resp 08/13/16 2030 18     Temp 08/13/16 2030 98.1 F (36.7 C)     Temp Source 08/13/16 2030 Oral     SpO2 08/13/16 2030 98 %     Weight 08/13/16 2033 199 lb  (90.3 kg)     Height --      Head Circumference --      Peak Flow --      Pain Score 08/13/16 2033 6     Pain Loc --      Pain Edu? --      Excl. in [place]? --     Constitutional: Alert and oriented. Well appearing and in no acute distress. Eyes: Conjunctivae are normal. PERRL. EOMI. Head: Atraumatic. Mouth/Throat: Mucous membranes are moist.  Oropharynx non-erythematous. Neck: No stridor.   Cardiovascular: Normal rate, regular rhythm. Good peripheral circulation. Grossly normal heart sounds. Respiratory: Normal respiratory effort.  No retractions. Lungs CTAB. Gastrointestinal: Soft and nontender. No distention.  Musculoskeletal: No lower extremity tenderness nor edema. No gross deformities of extremities. Neurologic:  Normal speech and language. No gross focal neurologic deficits are appreciated.  Skin:  Skin is warm, dry and intact. No rash noted. Psychiatric: Mood and affect are normal. Speech and behavior are normal.  ____________________________________________   LABS (all labs ordered are listed, but only abnormal results are displayed)  Labs Reviewed  CBC - Abnormal; Notable for the following:       Result Value   WBC 14.3 (*)    RBC 5.40 (*)    RDW 15.0 (*)    All other components within normal limits  BASIC METABOLIC PANEL - Abnormal; Notable for the following:    Glucose, Bld 119 (*)    All other components within normal limits  TROPONIN I   ____________________________________________  EKG  ED ECG REPORT I, Penuelas N Dael Howland, the attending physician, personally viewed and interpreted this ECG.   Date: 08/14/2016  EKG Time: 8:36 PM  Rate: 81  Rhythm: Normal sinus rhythm  Axis: Normal  Intervals: Normal  ST&T Change: None  ____________________________________________  RADIOLOGY I, Stafford N Orren Pietsch, personally viewed and evaluated these images (plain radiographs) as part of my medical decision making, as well as reviewing the written report by the  radiologist.  Dg Chest 2 View  Result Date: 08/13/2016 CLINICAL DATA:  46 year old female with history of upper anterior chest pain radiating into the neck and intermittent airway tightening getting worse during exertion for the past 3 days. Headache. EXAM: CHEST  2 VIEW COMPARISON:  No priors. FINDINGS: Lung volumes are normal. No consolidative airspace disease. No pleural effusions. No pneumothorax. No pulmonary nodule or mass noted. Pulmonary vasculature and the cardiomediastinal silhouette are within normal limits. IMPRESSION: No radiographic evidence of acute cardiopulmonary disease. Electronically Signed   By: Vinnie Langton M.D.   On: 08/13/2016 10:14     Procedures   ____________________________________________   INITIAL IMPRESSION / ASSESSMENT AND PLAN / ED COURSE  Pertinent labs & imaging results that were available during my care of the patient were reviewed by me and  considered in my medical decision making (see chart for details).  46 year old female presenting with chest and neck and headache 4 days. EKG revealed no evidence of ischemia or infarction. Troponin negative 2. D-dimer also negative. She states the pain is predominantly in her neck and head and described as pressure in the neck worse with laying flat CT angiogram of the head and neck performed which was negative. Did evaluate the patient's left parotid gland no pain with palpation no overlying erythema, no swelling  Patient given IV morphine stating the pain is much improved at this time.     ____________________________________________  FINAL CLINICAL IMPRESSION(S) / ED DIAGNOSES  Final diagnoses:  Chest pain, unspecified type     MEDICATIONS GIVEN DURING THIS VISIT:  Medications - No data to display   NEW OUTPATIENT MEDICATIONS STARTED DURING THIS VISIT:  New Prescriptions   No medications on file    Modified Medications   No medications on file    Discontinued Medications   LORCASERIN  HCL ER (BELVIQ XR) 20 MG TB24    Take 1 tablet by mouth daily.     Note:  This document was prepared using Dragon voice recognition software and may include unintentional dictation errors.    Gregor Hams, MD 08/14/16 [id number]

## 2016-08-14 NOTE — ED Notes (Signed)
Patient @ CT

## 2016-08-21 ENCOUNTER — Other Ambulatory Visit: Payer: Self-pay | Admitting: Family Medicine

## 2016-08-21 DIAGNOSIS — I10 Essential (primary) hypertension: Secondary | ICD-10-CM

## 2016-08-31 ENCOUNTER — Ambulatory Visit: Payer: BLUE CROSS/BLUE SHIELD | Admitting: Cardiovascular Disease

## 2016-09-06 ENCOUNTER — Other Ambulatory Visit: Payer: Self-pay | Admitting: Family Medicine

## 2016-09-06 DIAGNOSIS — J0100 Acute maxillary sinusitis, unspecified: Secondary | ICD-10-CM

## 2016-09-06 DIAGNOSIS — E785 Hyperlipidemia, unspecified: Secondary | ICD-10-CM

## 2016-09-17 ENCOUNTER — Other Ambulatory Visit: Payer: Self-pay | Admitting: Family Medicine

## 2016-09-17 DIAGNOSIS — I10 Essential (primary) hypertension: Secondary | ICD-10-CM

## 2016-09-19 ENCOUNTER — Other Ambulatory Visit: Payer: Self-pay | Admitting: Family Medicine

## 2016-09-19 DIAGNOSIS — J3089 Other allergic rhinitis: Secondary | ICD-10-CM

## 2016-09-20 DIAGNOSIS — M7552 Bursitis of left shoulder: Secondary | ICD-10-CM | POA: Diagnosis not present

## 2016-09-20 DIAGNOSIS — E669 Obesity, unspecified: Secondary | ICD-10-CM | POA: Diagnosis not present

## 2016-09-20 DIAGNOSIS — G5601 Carpal tunnel syndrome, right upper limb: Secondary | ICD-10-CM | POA: Diagnosis not present

## 2016-09-20 DIAGNOSIS — Z6832 Body mass index (BMI) 32.0-32.9, adult: Secondary | ICD-10-CM | POA: Diagnosis not present

## 2016-10-05 ENCOUNTER — Other Ambulatory Visit: Payer: Self-pay | Admitting: Family Medicine

## 2016-10-05 ENCOUNTER — Telehealth: Payer: Self-pay | Admitting: Family Medicine

## 2016-10-05 DIAGNOSIS — I10 Essential (primary) hypertension: Secondary | ICD-10-CM

## 2016-10-05 DIAGNOSIS — E7800 Pure hypercholesterolemia, unspecified: Secondary | ICD-10-CM | POA: Diagnosis not present

## 2016-10-05 NOTE — Telephone Encounter (Signed)
Dr. Manuella Ghazi is it ok to adjust to 90 - day supply?  Pt states that you had ordered ramipril 30 day supply, however she would like it to be 90 day supply. Please resend

## 2016-10-05 NOTE — Telephone Encounter (Signed)
Pt states that you had ordered ramipril 30 day supply, however she would like it to be 90 day supply. Please resend

## 2016-10-06 LAB — LIPID PANEL W/O CHOL/HDL RATIO
Cholesterol, Total: 152 mg/dL (ref 100–199)
HDL: 53 mg/dL (ref >39)
LDL Calculated: 89 mg/dL (ref 0–99)
Triglycerides: 50 mg/dL (ref 0–149)
VLDL Cholesterol Cal: 10 mg/dL (ref 5–40)

## 2016-10-06 LAB — COMPREHENSIVE METABOLIC PANEL
ALT: 13 IU/L (ref 0–32)
AST: 15 IU/L (ref 0–40)
Albumin/Globulin Ratio: 1.9 (ref 1.2–2.2)
Albumin: 4.4 g/dL (ref 3.5–5.5)
Alkaline Phosphatase: 46 IU/L (ref 39–117)
BUN/Creatinine Ratio: 19 (ref 9–23)
BUN: 18 mg/dL (ref 6–24)
Bilirubin Total: 0.3 mg/dL (ref 0.0–1.2)
CO2: 25 mmol/L (ref 20–29)
Calcium: 9.2 mg/dL (ref 8.7–10.2)
Chloride: 102 mmol/L (ref 96–106)
Creatinine, Ser: 0.93 mg/dL (ref 0.57–1.00)
GFR calc Af Amer: 86 mL/min/{1.73_m2} (ref >59)
GFR calc non Af Amer: 74 mL/min/{1.73_m2} (ref >59)
Globulin, Total: 2.3 g/dL (ref 1.5–4.5)
Glucose: 77 mg/dL (ref 65–99)
Potassium: 4.1 mmol/L (ref 3.5–5.2)
Sodium: 138 mmol/L (ref 134–144)
Total Protein: 6.7 g/dL (ref 6.0–8.5)

## 2016-10-06 MED ORDER — RAMIPRIL 10 MG PO CAPS: 10.0000 mg | ORAL_CAPSULE | Freq: Every morning | ORAL | 0 refills | Status: DC

## 2016-10-06 NOTE — Telephone Encounter (Signed)
90 day supply of ramipril sent to patient's pharmacy

## 2016-10-10 ENCOUNTER — Other Ambulatory Visit: Payer: Self-pay | Admitting: Family Medicine

## 2016-10-10 DIAGNOSIS — J0100 Acute maxillary sinusitis, unspecified: Secondary | ICD-10-CM

## 2016-11-08 ENCOUNTER — Ambulatory Visit (INDEPENDENT_AMBULATORY_CARE_PROVIDER_SITE_OTHER): Payer: BLUE CROSS/BLUE SHIELD | Admitting: Family Medicine

## 2016-11-08 ENCOUNTER — Encounter: Payer: Self-pay | Admitting: Family Medicine

## 2016-11-08 VITALS — BP 130/72 | HR 68 | Temp 97.7°F | Resp 16 | Ht 64.0 in | Wt 186.7 lb

## 2016-11-08 DIAGNOSIS — I10 Essential (primary) hypertension: Secondary | ICD-10-CM

## 2016-11-08 DIAGNOSIS — F419 Anxiety disorder, unspecified: Secondary | ICD-10-CM | POA: Diagnosis not present

## 2016-11-08 DIAGNOSIS — N921 Excessive and frequent menstruation with irregular cycle: Secondary | ICD-10-CM

## 2016-11-08 DIAGNOSIS — E785 Hyperlipidemia, unspecified: Secondary | ICD-10-CM | POA: Diagnosis not present

## 2016-11-08 MED ORDER — LOVASTATIN 40 MG PO TABS
ORAL_TABLET | ORAL | 1 refills | Status: DC
Start: 2016-11-08 — End: 2022-06-01

## 2016-11-08 MED ORDER — BUSPIRONE HCL 30 MG PO TABS: 30.0000 mg | ORAL_TABLET | Freq: Two times a day (BID) | ORAL | 0 refills | Status: DC

## 2016-11-08 NOTE — Progress Notes (Signed)
Name: Morgan Gordon   MRN: [id number]    DOB: [birth year]   Date:11/08/2016       Progress Note  Subjective  Chief Complaint  Chief Complaint  Patient presents with  . Follow-up    3 mo  . Hyperlipidemia    Hyperlipidemia  This is a chronic problem. The problem is controlled. Recent lipid tests were reviewed and are normal. Pertinent negatives include no chest pain, focal sensory loss, leg pain, myalgias or shortness of breath. Current antihyperlipidemic treatment includes statins.  Hypertension  This is a chronic problem. The problem is unchanged. The problem is controlled. Associated symptoms include anxiety. Pertinent negatives include no blurred vision, chest pain, headaches, palpitations, shortness of breath or sweats. Past treatments include ACE inhibitors.  Anxiety  Presents for follow-up visit. Symptoms include excessive worry, nervous/anxious behavior and panic. Patient reports no chest pain, depressed mood, palpitations or shortness of breath. The severity of symptoms is moderate and causing significant distress. The quality of sleep is fair.     Pt. Is requesting a referral to Gynecology for prolonged menstrual periods (normally last 4-5 days), in this cycle, her bleeding has lasted for over 2 weeks, the flow is light, no cramping but has experienced two episodes of sharp pain on her left sides. She is sexually active, had tubal ligation 20 years ago.    Past Medical History:  Diagnosis Date  . Allergy    Year-round allergies and sinus infections.  . Anxiety   . Arthritis    Osteoarthritis in hips  . Bursitis    arms and neck  . Facet syndrome, lumbar ([place])    Sees Chiropractor in [place]  . History of genital warts    Had D and C  . Hyperlipidemia   . Hypertension     Past Surgical History:  Procedure Laterality Date  . TUBAL LIGATION  1997    Family History  Problem Relation Age of Onset  . Thyroid disease Mother   . Cancer Mother        breast  .  Diabetes Mother   . Cancer Father        lymph node cancer  . Diabetes Father   . Diabetes Brother   . Thyroid disease Brother     Social History   Social History  . Marital status: Married    Spouse name: N/A  . Number of children: N/A  . Years of education: N/A   Occupational History  . Not on file.   Social History Main Topics  . Smoking status: Former Smoker    Packs/day: 0.50    Types: Cigarettes    Quit date: 06/24/2013  . Smokeless tobacco: Never Used  . Alcohol use No  . Drug use: No  . Sexual activity: Yes   Other Topics Concern  . Not on file   Social History Narrative  . No narrative on file     Current Outpatient Prescriptions:  .  aspirin 81 MG tablet, Take 81 mg by mouth daily., Disp: , Rfl:  .  busPIRone (BUSPAR) 30 MG tablet, TAKE 1 TABLET (30 MG TOTAL) BY MOUTH 2 (TWO) TIMES DAILY., Disp: 180 tablet, Rfl: 0 .  fluticasone (FLONASE) 50 MCG/ACT nasal spray, SPRAY 2 SPRAYS INTO EACH NOSTRIL EVERY DAY, Disp: 16 g, Rfl: 2 .  lovastatin (MEVACOR) 40 MG tablet, TAKE 1 TABLET EACH EVENING, Disp: 90 tablet, Rfl: 1 .  lovastatin (MEVACOR) 40 MG tablet, TAKE 1 TABLET EACH EVENING, Disp: 90 tablet,  Rfl: 1 .  meloxicam (MOBIC) 15 MG tablet, TAKE 1 TABLET (15 MG TOTAL) BY MOUTH ONCE DAILY., Disp: , Rfl: 1 .  montelukast (SINGULAIR) 10 MG tablet, TAKE 1 TABLET (10 MG TOTAL) BY MOUTH DAILY., Disp: 90 tablet, Rfl: 0 .  ramipril (ALTACE) 10 MG capsule, Take 1 capsule (10 mg total) by mouth every morning., Disp: 90 capsule, Rfl: 0  Allergies  Allergen Reactions  . Codeine Hives  . Penicillins Hives  . Krill Oil Rash     Review of Systems  Eyes: Negative for blurred vision.  Respiratory: Negative for shortness of breath.   Cardiovascular: Negative for chest pain and palpitations.  Musculoskeletal: Negative for myalgias.  Neurological: Negative for headaches.  Psychiatric/Behavioral: The patient is nervous/anxious.       Objective  Vitals:   11/08/16  1400  BP: 130/72  Pulse: 68  Resp: 16  Temp: 97.7 F (36.5 C)  TempSrc: Oral  SpO2: 97%  Weight: 186 lb 11.2 oz (84.7 kg)  Height: 5\' 4"  (1.626 m)    Physical Exam  Constitutional: She is oriented to person, place, and time and well-developed, well-nourished, and in no distress.  HENT:  Head: Normocephalic and atraumatic.  Cardiovascular: Normal rate, regular rhythm and normal heart sounds.   No murmur heard. Pulmonary/Chest: Effort normal and breath sounds normal. She has no wheezes.  Abdominal: Soft. Bowel sounds are normal. There is no tenderness.  Musculoskeletal: Normal range of motion. She exhibits no edema.  Neurological: She is alert and oriented to person, place, and time.  Psychiatric: Mood, memory, affect and judgment normal.  Nursing note and vitals reviewed.     Assessment & Plan  1. Anxiety Stable, continue on buspirone as prescribed - busPIRone (BUSPAR) 30 MG tablet; Take 1 tablet (30 mg total) by mouth 2 (two) times daily.  Dispense: 180 tablet; Refill: 0  2. Hyperlipidemia, unspecified hyperlipidemia type FLP at goal, continue on statin - lovastatin (MEVACOR) 40 MG tablet; TAKE 1 TABLET EACH EVENING  Dispense: 90 tablet; Refill: 1  3. Essential hypertension BP stable on present antihypertensive treatment  4. Prolonged menstrual cycle Urine pregnancy test is negative, referral to gynecology for management - Ambulatory referral to Gynecology - POCT urine pregnancy   Halena Mohar Asad [facility] 11/08/2016 2:30 PM

## 2016-11-09 ENCOUNTER — Other Ambulatory Visit: Payer: Self-pay | Admitting: Family Medicine

## 2016-11-09 DIAGNOSIS — J0100 Acute maxillary sinusitis, unspecified: Secondary | ICD-10-CM

## 2016-11-21 LAB — POCT URINE PREGNANCY: Preg Test, Ur: NEGATIVE

## 2016-11-27 DIAGNOSIS — H524 Presbyopia: Secondary | ICD-10-CM | POA: Diagnosis not present

## 2016-11-29 DIAGNOSIS — N939 Abnormal uterine and vaginal bleeding, unspecified: Secondary | ICD-10-CM | POA: Diagnosis not present

## 2016-12-03 ENCOUNTER — Other Ambulatory Visit: Payer: Self-pay | Admitting: Family Medicine

## 2016-12-03 DIAGNOSIS — F419 Anxiety disorder, unspecified: Secondary | ICD-10-CM

## 2016-12-03 MED ORDER — BUSPIRONE HCL 30 MG PO TABS: 30.0000 mg | ORAL_TABLET | Freq: Two times a day (BID) | ORAL | 0 refills | Status: DC

## 2016-12-04 ENCOUNTER — Other Ambulatory Visit: Payer: Self-pay | Admitting: Family Medicine

## 2016-12-04 DIAGNOSIS — F419 Anxiety disorder, unspecified: Secondary | ICD-10-CM

## 2016-12-07 ENCOUNTER — Other Ambulatory Visit: Payer: Self-pay | Admitting: Family Medicine

## 2016-12-07 DIAGNOSIS — J0100 Acute maxillary sinusitis, unspecified: Secondary | ICD-10-CM

## 2016-12-15 ENCOUNTER — Other Ambulatory Visit: Payer: Self-pay | Admitting: Family Medicine

## 2016-12-15 DIAGNOSIS — J3089 Other allergic rhinitis: Secondary | ICD-10-CM

## 2016-12-24 DIAGNOSIS — N83201 Unspecified ovarian cyst, right side: Secondary | ICD-10-CM | POA: Diagnosis not present

## 2016-12-24 DIAGNOSIS — Z30011 Encounter for initial prescription of contraceptive pills: Secondary | ICD-10-CM | POA: Diagnosis not present

## 2016-12-24 DIAGNOSIS — N939 Abnormal uterine and vaginal bleeding, unspecified: Secondary | ICD-10-CM | POA: Diagnosis not present

## 2016-12-25 DIAGNOSIS — N939 Abnormal uterine and vaginal bleeding, unspecified: Secondary | ICD-10-CM | POA: Diagnosis not present

## 2017-01-18 ENCOUNTER — Ambulatory Visit (INDEPENDENT_AMBULATORY_CARE_PROVIDER_SITE_OTHER): Payer: BLUE CROSS/BLUE SHIELD | Admitting: Family Medicine

## 2017-01-18 ENCOUNTER — Encounter: Payer: Self-pay | Admitting: Family Medicine

## 2017-01-18 VITALS — BP 122/74 | HR 74 | Temp 98.0°F | Resp 14 | Wt 188.0 lb

## 2017-01-18 DIAGNOSIS — H9202 Otalgia, left ear: Secondary | ICD-10-CM | POA: Diagnosis not present

## 2017-01-18 DIAGNOSIS — J0101 Acute recurrent maxillary sinusitis: Secondary | ICD-10-CM

## 2017-01-18 MED ORDER — DOXYCYCLINE HYCLATE 100 MG PO TABS: 100.0000 mg | ORAL_TABLET | Freq: Two times a day (BID) | ORAL | 0 refills | Status: DC

## 2017-01-18 NOTE — Progress Notes (Signed)
BP 122/74   Pulse 74   Temp 98 F (36.7 C) (Oral)   Resp 14   Wt 188 lb (85.3 kg)   SpO2 96%   BMI 32.27 kg/m    Subjective:    Patient ID: Morgan Gordon, female    DOB: 1970/08/02, 46 y.o.   MRN: [id number]  HPI: Morgan Gordon is a 46 y.o. female  Chief Complaint  Patient presents with  . Ear Pain    left    HPI Sick for 4 days; left ear pain; just too painful No fevers Feels like she is in a tunnel Weird feeling like something crawling in her ear Never had a problem with wax She sees Dr. Kathyrn Sheriff (ENT); used to get swimmer's ear, then double ear infection after that It's been a while since last ear infection No exposure to health care, but dust and oil and wind with fans Husband sleeps with fan too, air bothers her, especially now No sore throat, just scratchy this morning Pain across the side of the face; no rash or blisters Feels some pressure Taking flonase and allergy pill; clogging and drainage in the left side, bad stuffiness No cavities on the left side especially  Depression screen [facility] 2/9 01/18/2017 11/08/2016 08/09/2016 04/03/2016 02/17/2016  Decreased Interest 0 0 0 0 0  Down, Depressed, Hopeless 0 0 0 0 0  PHQ - 2 Score 0 0 0 0 0    Relevant past medical, surgical, family and social history reviewed Past Medical History:  Diagnosis Date  . Allergy    Year-round allergies and sinus infections.  . Anxiety   . Arthritis    Osteoarthritis in hips  . Bursitis    arms and neck  . Facet syndrome, lumbar    Sees Chiropractor in [place]  . History of genital warts    Had D and C  . Hyperlipidemia   . Hypertension    Past Surgical History:  Procedure Laterality Date  . TUBAL LIGATION  1997   Family History  Problem Relation Age of Onset  . Thyroid disease Mother   . Cancer Mother        breast  . Diabetes Mother   . Cancer Father        lymph node cancer  . Diabetes Father   . Diabetes Brother   . Thyroid disease Brother     Social History   Social History  . Marital status: Married    Spouse name: N/A  . Number of children: N/A  . Years of education: N/A   Occupational History  . Not on file.   Social History Main Topics  . Smoking status: Former Smoker    Packs/day: 0.50    Types: Cigarettes    Quit date: 06/24/2013  . Smokeless tobacco: Never Used  . Alcohol use No  . Drug use: No  . Sexual activity: Yes   Other Topics Concern  . Not on file   Social History Narrative  . No narrative on file    Interim medical history since last visit reviewed. Allergies and medications reviewed  Review of Systems Per HPI unless specifically indicated above     Objective:    BP 122/74   Pulse 74   Temp 98 F (36.7 C) (Oral)   Resp 14   Wt 188 lb (85.3 kg)   SpO2 96%   BMI 32.27 kg/m   Wt Readings from Last 3 Encounters:  01/18/17 188 lb (85.3 kg)  11/08/16 186 lb 11.2 oz (84.7 kg)  08/13/16 199 lb (90.3 kg)    Physical Exam  Constitutional: She appears well-developed and well-nourished. No distress.  HENT:  Head: Normocephalic and atraumatic.  Right Ear: Tympanic membrane, external ear and ear canal normal. No drainage. Tympanic membrane is not erythematous and not retracted.  Left Ear: Tympanic membrane, external ear and ear canal normal. No drainage. Tympanic membrane is not erythematous and not retracted.  Nose: Mucosal edema and rhinorrhea present. Left sinus exhibits maxillary sinus tenderness and frontal sinus tenderness.  Mouth/Throat: Mucous membranes are normal. No oropharyngeal exudate, posterior oropharyngeal edema or posterior oropharyngeal erythema.  Eyes: Right eye exhibits no discharge. Left eye exhibits no discharge.  Cardiovascular: Normal rate and regular rhythm.   Pulmonary/Chest: Effort normal and breath sounds normal. She has no wheezes.  Lymphadenopathy:    She has no cervical adenopathy.  Skin: No rash (no rash over the left side of the face or neck to suggest  shingles) noted. She is not diaphoretic.    Results for orders placed or performed in visit on 11/08/16  POCT urine pregnancy  Result Value Ref Range   Preg Test, Ur Negative Negative      Assessment & Plan:   Problem List Items Addressed This Visit    None    Visit Diagnoses    Otalgia of left ear    -  Primary   suspect related to left sided max sinusitis; hwoever, watch for shingles, TMJ in ddx as well; treat with ABX, call if needed   Acute recurrent maxillary sinusitis       suspected dx; start antibitiocs; cautioend about risk of C diff; see AVS; call if needed; may need to see Dr. Kathyrn Sheriff, ENT, if not improving   Relevant Medications   doxycycline (VIBRA-TABS) 100 MG tablet      Follow up plan: Return if symptoms worsen or fail to improve.  An after-visit summary was printed and given to the patient at [facility].  Please see the patient instructions which may contain other information and recommendations beyond what is mentioned above in the assessment and plan.  Meds ordered this encounter  Medications  . levonorgestrel-ethinyl estradiol (SEASONALE,INTROVALE,JOLESSA) 0.15-0.03 MG tablet    Sig: Take 1 tablet by mouth daily.    Refill:  3  . doxycycline (VIBRA-TABS) 100 MG tablet    Sig: Take 1 tablet (100 mg total) by mouth 2 (two) times daily.    Dispense:  20 tablet    Refill:  0  . aspirin 81 MG tablet    Sig: Take 1 tablet (81 mg total) by mouth daily. (take at least one hour BEFORE the meloxicam)    No orders of the defined types were placed in this encounter.

## 2017-01-18 NOTE — Patient Instructions (Signed)
Start the antibiotic Try vitamin C (orange juice if not diabetic or vitamin C tablets) and drink green tea to help your immune system during your illness Get plenty of rest and hydration Please do eat yogurt daily or take a probiotic daily for the next month or two We want to replace the healthy germs in the gut If you notice foul, watery diarrhea in the next two months, schedule an appointment RIGHT AWAY

## 2017-01-20 ENCOUNTER — Other Ambulatory Visit: Payer: Self-pay | Admitting: Family Medicine

## 2017-01-20 DIAGNOSIS — I10 Essential (primary) hypertension: Secondary | ICD-10-CM

## 2017-02-01 ENCOUNTER — Telehealth: Payer: Self-pay

## 2017-02-01 ENCOUNTER — Ambulatory Visit
Admission: EM | Admit: 2017-02-01 | Discharge: 2017-02-01 | Disposition: A | Discharge status: Home / Self Care | Payer: BLUE CROSS/BLUE SHIELD | Attending: Family Medicine | Admitting: Family Medicine

## 2017-02-01 ENCOUNTER — Telehealth: Payer: Self-pay | Admitting: Family Medicine

## 2017-02-01 ENCOUNTER — Ambulatory Visit: Payer: Self-pay

## 2017-02-01 ENCOUNTER — Encounter: Payer: Self-pay | Admitting: Emergency Medicine

## 2017-02-01 DIAGNOSIS — H6692 Otitis media, unspecified, left ear: Secondary | ICD-10-CM

## 2017-02-01 DIAGNOSIS — J0100 Acute maxillary sinusitis, unspecified: Secondary | ICD-10-CM

## 2017-02-01 MED ORDER — CEFDINIR 300 MG PO CAPS: 300.0000 mg | ORAL_CAPSULE | Freq: Two times a day (BID) | ORAL | 0 refills | Status: AC

## 2017-02-01 NOTE — Telephone Encounter (Signed)
Still having pain in left ear pain, now going to the right ear pain. She states she was going an antibiotic and took the medication as told and not working. Pt states she do not wasn't what Dr. lada gave her on 01/18/2017 but something else since she is experiencing left nasal congestion.  Pt states she is hurting really bad and would like something prescribe.

## 2017-02-01 NOTE — Telephone Encounter (Signed)
Spoke to Dr. Manuella Ghazi and called the pt and left her a messaged. Dr. Manuella Ghazi asking the pt to schedule an apt with her ENT doctor so he/she can look at her ears. Pt agreed

## 2017-02-01 NOTE — Telephone Encounter (Signed)
Copied from [place]. Topic: Quick Communication - See Telephone Encounter >> Feb 01, 2017 11:53 AM Corie Chiquito, NT wrote: CRM for notification. See Telephone encounter for:  02/01/17. Patient calling to speak with Arbie Cookey. If someone could please give her a call back

## 2017-02-01 NOTE — ED Triage Notes (Signed)
Patient c/o pain in her left ear for month.  Patient reports sinus congestion and pressure for a month also.

## 2017-02-01 NOTE — Telephone Encounter (Signed)
Please call on her home phone. Having trouble with cell

## 2017-02-01 NOTE — Telephone Encounter (Signed)
Agree with above, please schedule for an appointment

## 2017-02-01 NOTE — ED Provider Notes (Signed)
[facility] ____________________________________________  Time seen: Approximately 2:42 PM  I have reviewed the triage vital signs and the nursing notes.   HISTORY  Chief Complaint Sinus Problem and Otalgia   HPI Morgan Gordon is a 46 y.o. female presented for evaluation of 1 month of runny nose, nasal congestion, sinus pressure as well as left ear pain.  Patient reports sinus discomfort has come and gone intermittently, but no full resolution.  States left ear pain has been constant.  Patient states that she was seen by her primary doctor's office approximately 2 weeks ago and was treated with oral doxycycline without resolution.  States ear pain has continued and for the last few days sinus pressure is increased.  She is continues with thick nasal drainage and postnasal drainage. Also some right ear discomfort. Denies cough or chest discomfort.  Denies any fevers, sore throat,.  Patient reports that she does have chronic seasonal allergies and unsure if this started with allergies or viral infection.  Reports overall continues to eat and drink well as well as remain active.  Has been taking some over-the-counter coughing and cold congestion combination medications with minimal improvement, no resolution.  Denies other aggravating or relieving factors.Denies chest pain, shortness of breath, abdominal pain, dysuria, extremity pain, extremity swelling or rash. Denies recent sickness. Denies recent antibiotic use.   Roselee Nova, MD: PCP   Past Medical History:  Diagnosis Date  . Allergy    Year-round allergies and sinus infections.  . Anxiety   . Arthritis    Osteoarthritis in hips  . Bursitis    arms and neck  . Facet syndrome, lumbar    Sees Chiropractor in [place]  . History of genital warts    Had D and C  . Hyperlipidemia   . Hypertension     Patient Active Problem List   Diagnosis Date Noted  . Prolonged menstrual cycle 11/08/2016  . Prediabetes  08/09/2016  . Well woman exam without gynecological exam 04/03/2016  . Chronic left shoulder pain 02/17/2016  . Chronic right shoulder pain 08/30/2015  . Family history of diabetes mellitus in mother 06/02/2015  . Obesity 05/10/2015  . Hypertension 04/26/2015  . Arthritis 04/26/2015  . Anxiety 04/26/2015  . Hyperlipidemia 04/26/2015  . Environmental and seasonal allergies 04/26/2015    Past Surgical History:  Procedure Laterality Date  . TUBAL LIGATION  1997     No current facility-administered medications for this encounter.   Current Outpatient Prescriptions:  .  aspirin 81 MG tablet, Take 1 tablet (81 mg total) by mouth daily. (take at least one hour BEFORE the meloxicam), Disp: , Rfl:  .  busPIRone (BUSPAR) 30 MG tablet, Take 1 tablet (30 mg total) by mouth 2 (two) times daily., Disp: 180 tablet, Rfl: 0 .  cefdinir (OMNICEF) 300 MG capsule, Take 1 capsule (300 mg total) by mouth 2 (two) times daily., Disp: 20 capsule, Rfl: 0 .  fluticasone (FLONASE) 50 MCG/ACT nasal spray, SPRAY 2 SPRAYS INTO EACH NOSTRIL EVERY DAY, Disp: 16 g, Rfl: 2 .  levonorgestrel-ethinyl estradiol (SEASONALE,INTROVALE,JOLESSA) 0.15-0.03 MG tablet, Take 1 tablet by mouth daily., Disp: , Rfl: 3 .  lovastatin (MEVACOR) 40 MG tablet, TAKE 1 TABLET EACH EVENING (Patient not taking: Reported on 01/18/2017), Disp: 90 tablet, Rfl: 1 .  meloxicam (MOBIC) 15 MG tablet, TAKE 1 TABLET (15 MG TOTAL) BY MOUTH ONCE DAILY., Disp: , Rfl: 1 .  montelukast (SINGULAIR) 10 MG tablet, TAKE 1 TABLET BY MOUTH EVERY DAY,  Disp: 90 tablet, Rfl: 0 .  ramipril (ALTACE) 10 MG capsule, TAKE 1 CAPSULE (10 MG TOTAL) BY MOUTH EVERY MORNING., Disp: 90 capsule, Rfl: 0  Allergies Codeine; Penicillins; and Krill oil  Family History  Problem Relation Age of Onset  . Thyroid disease Mother   . Cancer Mother        breast  . Diabetes Mother   . Cancer Father        lymph node cancer  . Diabetes Father   . Diabetes Brother   . Thyroid  disease Brother     Social History Social History  Substance Use Topics  . Smoking status: Former Smoker    Packs/day: 0.50    Types: Cigarettes    Quit date: 06/24/2013  . Smokeless tobacco: Never Used  . Alcohol use No    Review of Systems Constitutional: No fever/chills ENT: No sore throat. As above.  Cardiovascular: Denies chest pain. Respiratory: Denies shortness of breath. Gastrointestinal: No abdominal pain.  No nausea, no vomiting.  No diarrhea.  No constipation. Genitourinary: Negative for dysuria. Musculoskeletal: Negative for back pain. Skin: Negative for rash.   ____________________________________________   PHYSICAL EXAM:  VITAL SIGNS: ED Triage Vitals  Enc Vitals Group     BP 02/01/17 1434 (!) 142/79     Pulse Rate 02/01/17 1434 65     Resp 02/01/17 1434 16     Temp 02/01/17 1434 98.3 F (36.8 C)     Temp Source 02/01/17 1434 Oral     SpO2 02/01/17 1434 100 %     Weight 02/01/17 1428 184 lb (83.5 kg)     Height 02/01/17 1428 5\' 4"  (1.626 m)     Head Circumference --      Peak Flow --      Pain Score 02/01/17 1430 6     Pain Loc --      Pain Edu? --      Excl. in [place]? --    Constitutional: Alert and oriented. Well appearing and in no acute distress. Eyes: Conjunctivae are normal.  Head: Atraumatic.Mild tenderness to palpation bilateral frontal and maxillary sinuses, moderate tenderness to bilateral maxillary.. No swelling. No erythema.   Ears: Right: Nontender, no erythema, normal TM.  Left: Nontender, normal canal, no drainage, moderate erythema, dull TM.  No surrounding tenderness, swelling or erythema bilaterally.  Nose: nasal congestion with bilateral nasal turbinate erythema and edema.   Mouth/Throat: Mucous membranes are moist.  Oropharynx non-erythematous.No tonsillar swelling or exudate.  Neck: No stridor.  No cervical spine tenderness to palpation. Hematological/Lymphatic/Immunilogical: No cervical lymphadenopathy. Cardiovascular: Normal  rate, regular rhythm. Grossly normal heart sounds.  Good peripheral circulation. Respiratory: Normal respiratory effort.  No retractions. No wheezes, rales or rhonchi. Good air movement.  Musculoskeletal: No cervical, thoracic or lumbar tenderness to palpation.  Neurologic:  Normal speech and language. No gait instability. Skin:  Skin is warm, dry and intact. No rash noted. Psychiatric: Mood and affect are normal. Speech and behavior are normal.  ___________________________________________   LABS (all labs ordered are listed, but only abnormal results are displayed)  Labs Reviewed - No data to display ____________________________________________    PROCEDURES Procedures    INITIAL IMPRESSION / ASSESSMENT AND PLAN / ED COURSE  Pertinent labs & imaging results that were available during my care of the patient were reviewed by me and considered in my medical decision making (see chart for details).  Well-appearing patient.  No acute distress.  Lungs clear throughout.  Suspect sinusitis  and left otitis media.  Will treat patient with oral cefdinir, patient reports is tolerated this well in past.  Encouraged rest, fluids, supportive care, follow up with ENT as needed, previously followed with [facility].Discussed indication, risks and benefits of medications with patient.  Discussed follow up with Primary care physician this week. Discussed follow up and return parameters including no resolution or any worsening concerns. Patient verbalized understanding and agreed to plan.   ____________________________________________   FINAL CLINICAL IMPRESSION(S) / ED DIAGNOSES  Final diagnoses:  Left otitis media, unspecified otitis media type  Acute maxillary sinusitis, recurrence not specified     Discharge Medication List as of 02/01/2017  3:00 PM    START taking these medications   Details  cefdinir (OMNICEF) 300 MG capsule Take 1 capsule (300 mg total) by mouth 2 (two) times daily.,  Starting Fri 02/01/2017, Until Mon 02/11/2017, Normal        Note: This dictation was prepared with Dragon dictation along with smaller phrase technology. Any transcriptional errors that result from this process are unintentional.         Marylene Land, NP 02/01/17 [phone]

## 2017-02-01 NOTE — Telephone Encounter (Signed)
We have tried to reach out to patient without success. Patient is wanting an antibiotic for ear infection.

## 2017-02-01 NOTE — Discharge Instructions (Signed)
Take medication as prescribed. Rest. Drink plenty of fluids.  ° °Follow up with your primary care physician this week as needed. Return to Urgent care for new or worsening concerns.  ° °

## 2017-02-06 NOTE — Telephone Encounter (Signed)
Pt. Husband called and said pt. Has an appt. With ENT on 02/22/17

## 2017-02-11 ENCOUNTER — Ambulatory Visit: Payer: BLUE CROSS/BLUE SHIELD | Admitting: Family Medicine

## 2017-02-20 ENCOUNTER — Ambulatory Visit: Payer: BLUE CROSS/BLUE SHIELD | Admitting: Family Medicine

## 2017-02-20 ENCOUNTER — Encounter: Payer: Self-pay | Admitting: Family Medicine

## 2017-02-20 DIAGNOSIS — K219 Gastro-esophageal reflux disease without esophagitis: Secondary | ICD-10-CM | POA: Diagnosis not present

## 2017-02-20 DIAGNOSIS — F419 Anxiety disorder, unspecified: Secondary | ICD-10-CM | POA: Diagnosis not present

## 2017-02-20 MED ORDER — PANTOPRAZOLE SODIUM 20 MG PO TBEC: 20.0000 mg | DELAYED_RELEASE_TABLET | Freq: Every day | ORAL | 0 refills | Status: DC

## 2017-02-20 MED ORDER — BUSPIRONE HCL 30 MG PO TABS: 30.0000 mg | ORAL_TABLET | Freq: Two times a day (BID) | ORAL | 0 refills | Status: DC

## 2017-02-20 NOTE — Progress Notes (Signed)
Name: Morgan Gordon   MRN: [id number]    DOB: 09-May-1970   Date:02/20/2017       Progress Note  Subjective  Chief Complaint  Chief Complaint  Patient presents with  . Hyperlipidemia    f/u  . Anxiety    f/u  . Medication Refill    buspar  . Hypertension    f/u    Hyperlipidemia  This is a chronic problem. The problem is controlled. Recent lipid tests were reviewed and are normal. Pertinent negatives include no chest pain, leg pain or shortness of breath. Current antihyperlipidemic treatment includes statins.  Anxiety  Presents for follow-up visit. Symptoms include insomnia and nausea. Patient reports no chest pain, depressed mood, excessive worry, nervous/anxious behavior, palpitations or shortness of breath. The severity of symptoms is moderate and causing significant distress.    Hypertension  This is a chronic problem. The problem is unchanged. The problem is controlled. Associated symptoms include anxiety. Pertinent negatives include no blurred vision, chest pain, headaches, palpitations or shortness of breath.  Gastroesophageal Reflux  She complains of choking, dysphagia, heartburn, nausea and a sore throat. She reports no abdominal pain, no chest pain or no coughing. This is a recurrent problem. The problem has been gradually worsening. Exacerbated by: most foods cause symptoms of reflux aspecifically spaghetti, ravioli, gravy, spicy foods.  Pertinent negatives include no fatigue or weight loss. Past procedures do not include an EGD or esophageal manometry.     Past Medical History:  Diagnosis Date  . Allergy    Year-round allergies and sinus infections.  . Anxiety   . Arthritis    Osteoarthritis in hips  . Bursitis    arms and neck  . Facet syndrome, lumbar    Sees Chiropractor in [place]  . History of genital warts    Had D and C  . Hyperlipidemia   . Hypertension     Past Surgical History:  Procedure Laterality Date  . TUBAL LIGATION  1997    Family  History  Problem Relation Age of Onset  . Thyroid disease Mother   . Cancer Mother        breast  . Diabetes Mother   . Cancer Father        lymph node cancer  . Diabetes Father   . Diabetes Brother   . Thyroid disease Brother     Social History   Socioeconomic History  . Marital status: Married    Spouse name: Not on file  . Number of children: Not on file  . Years of education: Not on file  . Highest education level: Not on file  Social Needs  . Financial resource strain: Not on file  . Food insecurity - worry: Not on file  . Food insecurity - inability: Not on file  . Transportation needs - medical: Not on file  . Transportation needs - non-medical: Not on file  Occupational History  . Not on file  Tobacco Use  . Smoking status: Former Smoker    Packs/day: 0.50    Types: Cigarettes    Last attempt to quit: 06/24/2013    Years since quitting: 3.6  . Smokeless tobacco: Never Used  Substance and Sexual Activity  . Alcohol use: No  . Drug use: No  . Sexual activity: Yes  Other Topics Concern  . Not on file  Social History Narrative  . Not on file     Current Outpatient Medications:  .  Ascorbic Acid (VITAMIN C) 1000 MG  tablet, Take 1 tablet daily by mouth., Disp: , Rfl:  .  aspirin 81 MG tablet, Take 1 tablet (81 mg total) by mouth daily. (take at least one hour BEFORE the meloxicam), Disp: , Rfl:  .  busPIRone (BUSPAR) 30 MG tablet, Take 1 tablet (30 mg total) by mouth 2 (two) times daily., Disp: 180 tablet, Rfl: 0 .  fluticasone (FLONASE) 50 MCG/ACT nasal spray, SPRAY 2 SPRAYS INTO EACH NOSTRIL EVERY DAY, Disp: 16 g, Rfl: 2 .  levonorgestrel-ethinyl estradiol (SEASONALE,INTROVALE,JOLESSA) 0.15-0.03 MG tablet, Take 1 tablet by mouth daily., Disp: , Rfl: 3 .  lovastatin (MEVACOR) 40 MG tablet, TAKE 1 TABLET EACH EVENING, Disp: 90 tablet, Rfl: 1 .  meloxicam (MOBIC) 15 MG tablet, TAKE 1 TABLET (15 MG TOTAL) BY MOUTH ONCE DAILY., Disp: , Rfl: 1 .  montelukast  (SINGULAIR) 10 MG tablet, TAKE 1 TABLET BY MOUTH EVERY DAY, Disp: 90 tablet, Rfl: 0 .  ramipril (ALTACE) 10 MG capsule, TAKE 1 CAPSULE (10 MG TOTAL) BY MOUTH EVERY MORNING., Disp: 90 capsule, Rfl: 0  Allergies  Allergen Reactions  . Codeine Hives  . Penicillins Hives  . Krill Oil Rash     Review of Systems  Constitutional: Negative for fatigue and weight loss.  HENT: Positive for sore throat.   Eyes: Negative for blurred vision.  Respiratory: Positive for choking. Negative for cough and shortness of breath.   Cardiovascular: Negative for chest pain and palpitations.  Gastrointestinal: Positive for dysphagia, heartburn and nausea. Negative for abdominal pain.  Neurological: Negative for headaches.  Psychiatric/Behavioral: The patient has insomnia. The patient is not nervous/anxious.       Objective  Vitals:   02/20/17 1441  BP: 130/74  Pulse: 81  Resp: 16  Temp: 98.1 F (36.7 C)  TempSrc: Oral  SpO2: 97%  Weight: 192 lb 1.6 oz (87.1 kg)    Physical Exam  Constitutional: She is oriented to person, place, and time and well-developed, well-nourished, and in no distress.  HENT:  Head: Normocephalic and atraumatic.  Cardiovascular: Normal rate, regular rhythm and normal heart sounds.  No murmur heard. Pulmonary/Chest: Effort normal and breath sounds normal. She has no wheezes.  Abdominal: Soft. Bowel sounds are normal. There is no tenderness.  Neurological: She is alert and oriented to person, place, and time.  Psychiatric: Mood, memory, affect and judgment normal.  Nursing note and vitals reviewed.        Assessment & Plan  1. Anxiety Stable and responsive buspirone taken twice daily - busPIRone (BUSPAR) 30 MG tablet; Take 1 tablet (30 mg total) 2 (two) times daily by mouth.  Dispense: 180 tablet; Refill: 0  2. Gastroesophageal reflux disease, esophagitis presence not specified Persistent symptoms, advised to avoid taking Tums, start on pantoprazole, refer to  [facility] for consideration of endoscopy - pantoprazole (PROTONIX) 20 MG tablet; Take 1 tablet (20 mg total) daily by mouth.  Dispense: 90 tablet; Refill: 0 - Ambulatory referral to Gastroenterology   Dossie Der Asad A. [facility] 02/20/2017 2:46 PM

## 2017-02-22 DIAGNOSIS — J342 Deviated nasal septum: Secondary | ICD-10-CM | POA: Diagnosis not present

## 2017-02-22 DIAGNOSIS — H6980 Other specified disorders of Eustachian tube, unspecified ear: Secondary | ICD-10-CM | POA: Diagnosis not present

## 2017-02-22 DIAGNOSIS — H93293 Other abnormal auditory perceptions, bilateral: Secondary | ICD-10-CM | POA: Diagnosis not present

## 2017-02-22 DIAGNOSIS — H9209 Otalgia, unspecified ear: Secondary | ICD-10-CM | POA: Diagnosis not present

## 2017-02-22 DIAGNOSIS — J3489 Other specified disorders of nose and nasal sinuses: Secondary | ICD-10-CM | POA: Diagnosis not present

## 2017-03-04 ENCOUNTER — Encounter: Payer: Self-pay | Admitting: Gastroenterology

## 2017-03-04 ENCOUNTER — Ambulatory Visit (INDEPENDENT_AMBULATORY_CARE_PROVIDER_SITE_OTHER): Payer: BLUE CROSS/BLUE SHIELD | Admitting: Gastroenterology

## 2017-03-04 ENCOUNTER — Other Ambulatory Visit: Payer: Self-pay | Admitting: Family Medicine

## 2017-03-04 VITALS — BP 134/87 | HR 70 | Temp 97.5°F | Ht 64.0 in | Wt 192.2 lb

## 2017-03-04 DIAGNOSIS — J0100 Acute maxillary sinusitis, unspecified: Secondary | ICD-10-CM

## 2017-03-04 DIAGNOSIS — R1013 Epigastric pain: Secondary | ICD-10-CM

## 2017-03-04 DIAGNOSIS — N939 Abnormal uterine and vaginal bleeding, unspecified: Secondary | ICD-10-CM | POA: Insufficient documentation

## 2017-03-04 DIAGNOSIS — K219 Gastro-esophageal reflux disease without esophagitis: Secondary | ICD-10-CM

## 2017-03-04 NOTE — Progress Notes (Signed)
Vonda Antigua, MD [address], [place], [place], [place], [address] [address], [place], [place], [place], [address] Phone: [phone]  Fax: [phone]  Consultation  Referring Provider:     Roselee Nova, MD Primary Care Physician:  Roselee Nova, MD Primary Gastroenterologist:  Virgel Manifold, MD        Reason for Consultation:     Acid reflux  Date of Consultation:  03/04/2017         HPI:   Morgan Gordon is a 46 y.o. female here for initial visit for acid reflux for years. Pt. Reports episodes of burning sensation in her chest with regurgitation. She reports her symptoms are much better with PPI. They were occurring daily, and was started on PPI 2 weeks ago and since then the episode has only occurred once. However, she also reports sensation of food getting stuck in her throat daily for years. This occurs with solid food only. She drinks liquids after this and it passes. Has not had to bring food back up. No weight loss. This has improved with PPI as well. No family hx of colon or GI cancers. No hematemesis, altered bowel habits, or blood in stool. Also reports symptoms of dyspepsia.   Past Medical History:  Diagnosis Date  . Allergy    Year-round allergies and sinus infections.  . Anxiety   . Arthritis    Osteoarthritis in hips  . Bursitis    arms and neck  . Facet syndrome, lumbar    Sees Chiropractor in [place]  . History of genital warts    Had D and C  . Hyperlipidemia   . Hypertension     Past Surgical History:  Procedure Laterality Date  . TUBAL LIGATION  1997    Prior to Admission medications   Medication Sig Start Date End Date Taking? Authorizing Provider  Ascorbic Acid (VITAMIN C) 1000 MG tablet Take 1 tablet daily by mouth.   Yes [provider]  aspirin 81 MG tablet Take 1 tablet (81 mg total) by mouth daily. (take at least one hour BEFORE the meloxicam) 01/18/17  Yes Lada, Satira Anis, MD  busPIRone (BUSPAR) 30  MG tablet Take 1 tablet (30 mg total) 2 (two) times daily by mouth. 02/20/17  Yes Roselee Nova, MD  fluticasone (FLONASE) 50 MCG/ACT nasal spray SPRAY 2 SPRAYS INTO EACH NOSTRIL EVERY DAY 03/04/17  Yes Roselee Nova, MD  levonorgestrel-ethinyl estradiol (SEASONALE,INTROVALE,JOLESSA) 0.15-0.03 MG tablet Take 1 tablet by mouth daily. 12/24/16  Yes [provider]  lovastatin (MEVACOR) 40 MG tablet TAKE 1 TABLET EACH EVENING 11/08/16  Yes Keith Rake Asad A, MD  meloxicam (MOBIC) 15 MG tablet TAKE 1 TABLET (15 MG TOTAL) BY MOUTH ONCE DAILY. 03/21/16  Yes [provider]  montelukast (SINGULAIR) 10 MG tablet TAKE 1 TABLET BY MOUTH EVERY DAY 12/17/16  Yes Keith Rake Asad A, MD  pantoprazole (PROTONIX) 20 MG tablet Take 1 tablet (20 mg total) daily by mouth. 02/20/17  Yes Rochel Brome A, MD  ramipril (ALTACE) 10 MG capsule TAKE 1 CAPSULE (10 MG TOTAL) BY MOUTH EVERY MORNING. 01/21/17  Yes Roselee Nova, MD    Family History  Problem Relation Age of Onset  . Thyroid disease Mother   . Cancer Mother        breast  . Diabetes Mother   . Cancer Father        lymph node cancer  .  Diabetes Father   . Diabetes Brother   . Thyroid disease Brother   . Colon cancer Maternal Grandmother      Social History   Tobacco Use  . Smoking status: Former Smoker    Packs/day: 0.50    Types: Cigarettes    Last attempt to quit: 06/24/2013    Years since quitting: 3.6  . Smokeless tobacco: Never Used  Substance Use Topics  . Alcohol use: No  . Drug use: No    Allergies as of 03/04/2017 - Review Complete 03/04/2017  Allergen Reaction Noted  . Codeine Hives 03/24/2015  . Penicillins Hives 03/24/2015  . Krill oil Rash 03/24/2015    Review of Systems:    All systems reviewed and negative except where noted in HPI.   Physical Exam:  Vital signs in last 24 hours: Vitals:   03/04/17 1302  BP: 134/87  Pulse: 70  Temp: (!) 97.5 F (36.4 C)  TempSrc: Oral  Weight: 87.2 kg  (192 lb 3.2 oz)  Height: 5\' 4"  (1.626 m)     General:   Pleasant, cooperative in NAD Head:  Normocephalic and atraumatic. Eyes:   No icterus.   Conjunctiva pink. PERRLA. Ears:  Normal auditory acuity. Neck:  Supple; no masses or thyroidomegaly Lungs: Respirations even and unlabored. Lungs clear to auscultation bilaterally.   No wheezes, crackles, or rhonchi.  Heart:  Regular rate and rhythm;  Without murmur, clicks, rubs or gallops Abdomen:  Soft, nondistended, nontender. Normal bowel sounds. No appreciable masses or hepatomegaly.  No rebound or guarding.  Neurologic:  Alert and oriented x3;  grossly normal neurologically. Skin:  Intact without significant lesions or rashes. Cervical Nodes:  No significant cervical adenopathy. Psych:  Alert and cooperative. Normal affect.  LAB RESULTS: No results for input(s): WBC, HGB, HCT, PLT in the last 72 hours. BMET No results for input(s): NA, K, CL, CO2, GLUCOSE, BUN, CREATININE, CALCIUM in the last 72 hours. LFT No results for input(s): PROT, ALBUMIN, AST, ALT, ALKPHOS, BILITOT, BILIDIR, IBILI in the last 72 hours. PT/INR No results for input(s): LABPROT, INR in the last 72 hours.  CBC reviewed from May 2018.   STUDIES: No results found.    Impression / Plan:   Morgan Gordon is a 46 y.o. y/o female with acid reflux, dyspepsia and dysphagia  Due to symptoms of dysphagia, EGD is indicated at this time to evaluate for rings, strictures or EoE Symptoms have improved with PPI. Ok to continue at this time with goal of lowest dose possible or tapering off longterm with lifestyle modifications if possible. Risks of PPI use were discussed with patient including bone loss, C. Diff diarrhea, pneumonia, infections, CKD, electrolyte abnormalities. Pt. Verbalizes understanding and chooses to continue the medication.   Will obtain stomach Bx for H. Pylori during procedure due to Dyspepsia  Acid reflux lifestyle modifications discussed in  detail with the patient as well  Thank you for involving me in the care of this patient.      Virgel Manifold, MD  03/04/2017, 1:44 PM

## 2017-03-06 ENCOUNTER — Other Ambulatory Visit: Payer: Self-pay

## 2017-03-06 ENCOUNTER — Encounter: Payer: Self-pay | Admitting: *Deleted

## 2017-03-08 ENCOUNTER — Ambulatory Visit: Payer: BLUE CROSS/BLUE SHIELD | Admitting: Anesthesiology

## 2017-03-08 ENCOUNTER — Encounter
Admission: RE | Disposition: A | Discharge status: Home / Self Care | Payer: Self-pay | Source: Ambulatory Visit | Attending: Gastroenterology

## 2017-03-08 ENCOUNTER — Encounter: Payer: Self-pay | Admitting: *Deleted

## 2017-03-08 ENCOUNTER — Ambulatory Visit
Admission: RE | Admit: 2017-03-08 | Discharge: 2017-03-08 | Disposition: A | Discharge status: Home / Self Care | Payer: BLUE CROSS/BLUE SHIELD | Source: Ambulatory Visit | Attending: Gastroenterology | Admitting: Gastroenterology

## 2017-03-08 DIAGNOSIS — Z7982 Long term (current) use of aspirin: Secondary | ICD-10-CM | POA: Diagnosis not present

## 2017-03-08 DIAGNOSIS — M160 Bilateral primary osteoarthritis of hip: Secondary | ICD-10-CM | POA: Insufficient documentation

## 2017-03-08 DIAGNOSIS — K219 Gastro-esophageal reflux disease without esophagitis: Secondary | ICD-10-CM

## 2017-03-08 DIAGNOSIS — Z8742 Personal history of other diseases of the female genital tract: Secondary | ICD-10-CM | POA: Diagnosis not present

## 2017-03-08 DIAGNOSIS — K210 Gastro-esophageal reflux disease with esophagitis: Secondary | ICD-10-CM | POA: Diagnosis not present

## 2017-03-08 DIAGNOSIS — Z880 Allergy status to penicillin: Secondary | ICD-10-CM | POA: Insufficient documentation

## 2017-03-08 DIAGNOSIS — K3189 Other diseases of stomach and duodenum: Secondary | ICD-10-CM | POA: Insufficient documentation

## 2017-03-08 DIAGNOSIS — R1319 Other dysphagia: Secondary | ICD-10-CM

## 2017-03-08 DIAGNOSIS — E669 Obesity, unspecified: Secondary | ICD-10-CM | POA: Diagnosis not present

## 2017-03-08 DIAGNOSIS — K209 Esophagitis, unspecified without bleeding: Secondary | ICD-10-CM

## 2017-03-08 DIAGNOSIS — G5603 Carpal tunnel syndrome, bilateral upper limbs: Secondary | ICD-10-CM | POA: Insufficient documentation

## 2017-03-08 DIAGNOSIS — M719 Bursopathy, unspecified: Secondary | ICD-10-CM | POA: Insufficient documentation

## 2017-03-08 DIAGNOSIS — E785 Hyperlipidemia, unspecified: Secondary | ICD-10-CM | POA: Insufficient documentation

## 2017-03-08 DIAGNOSIS — R12 Heartburn: Secondary | ICD-10-CM

## 2017-03-08 DIAGNOSIS — R1310 Dysphagia, unspecified: Secondary | ICD-10-CM | POA: Insufficient documentation

## 2017-03-08 DIAGNOSIS — Z87891 Personal history of nicotine dependence: Secondary | ICD-10-CM | POA: Diagnosis not present

## 2017-03-08 DIAGNOSIS — Z91048 Other nonmedicinal substance allergy status: Secondary | ICD-10-CM | POA: Insufficient documentation

## 2017-03-08 DIAGNOSIS — F419 Anxiety disorder, unspecified: Secondary | ICD-10-CM | POA: Diagnosis not present

## 2017-03-08 DIAGNOSIS — Z6833 Body mass index (BMI) 33.0-33.9, adult: Secondary | ICD-10-CM | POA: Insufficient documentation

## 2017-03-08 DIAGNOSIS — K2950 Unspecified chronic gastritis without bleeding: Secondary | ICD-10-CM | POA: Diagnosis not present

## 2017-03-08 DIAGNOSIS — K2960 Other gastritis without bleeding: Secondary | ICD-10-CM | POA: Diagnosis not present

## 2017-03-08 DIAGNOSIS — K259 Gastric ulcer, unspecified as acute or chronic, without hemorrhage or perforation: Secondary | ICD-10-CM | POA: Insufficient documentation

## 2017-03-08 DIAGNOSIS — Z885 Allergy status to narcotic agent status: Secondary | ICD-10-CM | POA: Insufficient documentation

## 2017-03-08 DIAGNOSIS — I10 Essential (primary) hypertension: Secondary | ICD-10-CM | POA: Insufficient documentation

## 2017-03-08 HISTORY — PX: ESOPHAGOGASTRODUODENOSCOPY (EGD) WITH PROPOFOL: SHX5813

## 2017-03-08 LAB — POCT PREGNANCY, URINE: Preg Test, Ur: NEGATIVE

## 2017-03-08 SURGERY — ESOPHAGOGASTRODUODENOSCOPY (EGD) WITH PROPOFOL
Anesthesia: General

## 2017-03-08 MED ORDER — FENTANYL CITRATE (PF) 100 MCG/2ML IJ SOLN
INTRAMUSCULAR | Status: DC | PRN
  Administered 2017-03-08 (×2): 50 ug via INTRAVENOUS

## 2017-03-08 MED ORDER — PROPOFOL 500 MG/50ML IV EMUL
INTRAVENOUS | Status: AC
  Filled 2017-03-08: qty 50

## 2017-03-08 MED ORDER — FENTANYL CITRATE (PF) 100 MCG/2ML IJ SOLN
INTRAMUSCULAR | Status: AC
  Filled 2017-03-08: qty 2

## 2017-03-08 MED ORDER — LIDOCAINE 2% (20 MG/ML) 5 ML SYRINGE
INTRAMUSCULAR | Status: DC | PRN
  Administered 2017-03-08: 40 mg via INTRAVENOUS

## 2017-03-08 MED ORDER — PROPOFOL 10 MG/ML IV BOLUS
INTRAVENOUS | Status: DC | PRN
  Administered 2017-03-08: 100 mg via INTRAVENOUS

## 2017-03-08 MED ORDER — SODIUM CHLORIDE 0.9 % IV SOLN
INTRAVENOUS | Status: DC
  Administered 2017-03-08 (×2): via INTRAVENOUS

## 2017-03-08 MED ORDER — PROPOFOL 500 MG/50ML IV EMUL
INTRAVENOUS | Status: DC | PRN
  Administered 2017-03-08: 160 ug/kg/min via INTRAVENOUS

## 2017-03-08 NOTE — Anesthesia Post-op Follow-up Note (Signed)
Anesthesia QCDR form completed.        

## 2017-03-08 NOTE — Brief Op Note (Signed)
Gauze for stabilization placed between upper right canine and biteblock due to loose tooth.

## 2017-03-08 NOTE — Anesthesia Postprocedure Evaluation (Signed)
Anesthesia Post Note  Patient: Morgan Gordon  Procedure(s) Performed: ESOPHAGOGASTRODUODENOSCOPY (EGD) WITH PROPOFOL (N/A )  Patient location during evaluation: Endoscopy Anesthesia Type: General Level of consciousness: awake and alert and oriented Pain management: pain level controlled Vital Signs Assessment: post-procedure vital signs reviewed and stable Respiratory status: spontaneous breathing, nonlabored ventilation and respiratory function stable Cardiovascular status: blood pressure returned to baseline and stable Postop Assessment: no signs of nausea or vomiting Anesthetic complications: no     Last Vitals:  Vitals:   03/08/17 1508 03/08/17 1517  BP: 128/68 (!) 133/91  Pulse: 72 73  Resp: 17 17  Temp: 36.4 C   SpO2: 100% 100%    Last Pain:  Vitals:   03/08/17 1507  TempSrc: Tympanic                 Kanen Mottola

## 2017-03-08 NOTE — Op Note (Signed)
[facility] Gastroenterology Patient Name: Morgan Gordon Procedure Date: 03/08/2017 2:33 PM MRN: [id number] Account #: [id number] Date of Birth: Sep 05, 1970 Admit Type: Outpatient Age: 46 Room: [facility] ENDO ROOM 4 Gender: Female Note Status: Finalized Procedure:            Upper GI endoscopy Indications:          Dysphagia, Heartburn Providers:            Ryver Zadrozny B. Bonna Gains MD, MD Referring MD:         Otila Back Manuella Ghazi (Referring MD) Medicines:            Monitored Anesthesia Care Complications:        No immediate complications. Procedure:            Pre-Anesthesia Assessment:                       - The risks and benefits of the procedure and the                        sedation options and risks were discussed with the                        patient. All questions were answered and informed                        consent was obtained.                       - Patient identification and proposed procedure were                        verified prior to the procedure.                       - ASA Grade Assessment: II - A patient with mild                        systemic disease.                       After obtaining informed consent, the endoscope was                        passed under direct vision. Throughout the procedure,                        the patient's blood pressure, pulse, and oxygen                        saturations were monitored continuously. The                        Colonoscope was introduced through the mouth, and                        advanced to the second part of duodenum. The upper GI                        endoscopy was accomplished with ease. The patient  tolerated the procedure well. Findings:      The Z-line was irregular and was found 35 cm from the incisors.      2 small Islands of salmon-colored mucosa were present at 35 cm at the GE       junction. Biopsies were taken with a cold forceps for histology.       Mucosal changes including longitudinal furrows were found in the entire       esophagus. Biopsies were obtained from the proximal and distal esophagus       with cold forceps for histology of suspected eosinophilic esophagitis.      One, non-bleeding erosion was found in the gastric antrum.      Patchy mildly erythematous mucosa without bleeding was found in the       stomach. Biopsies were taken with a cold forceps for histology.      The duodenal bulb and second portion of the duodenum were normal. Impression:           - Salmon-colored mucosa. Biopsied.                       - Esophageal mucosal changes suggestive of eosinophilic                        esophagitis. Biopsied.                       - Erythematous mucosa in the stomach. Biopsied.                       - One small gastric erosion seen.                       - Normal duodenal bulb and second portion of the                        duodenum. Recommendation:       - Await pathology results.                       - Discharge patient to home.                       - Resume previous diet.                       - Continue present medications.                       - Return to my office in 4 weeks. Procedure Code(s):    --- Professional ---                       [phone], Esophagogastroduodenoscopy, flexible, transoral;                        with biopsy, single or multiple Diagnosis Code(s):    --- Professional ---                       K22.8, Other specified diseases of esophagus                       K20.9, Esophagitis, unspecified  K31.89, Other diseases of stomach and duodenum                       R13.10, Dysphagia, unspecified                       R12, Heartburn CPT copyright 2016 American Medical Association. All rights reserved. The codes documented in this report are preliminary and upon coder review may  be revised to meet current compliance requirements.  Vonda Antigua, MD Margretta Sidle B. Bonna Gains  MD, MD 03/08/2017 3:07:56 PM This report has been signed electronically. Number of Addenda: 0 Note Initiated On: 03/08/2017 2:33 PM      [facility]

## 2017-03-08 NOTE — Anesthesia Preprocedure Evaluation (Signed)
Anesthesia Evaluation  Patient identified by MRN, date of birth, ID band Patient awake    Reviewed: Allergy & Precautions, NPO status , Patient's Chart, lab work & pertinent test results  History of Anesthesia Complications Negative for: history of anesthetic complications  Airway Mallampati: II  TM Distance: >3 FB Neck ROM: Full    Dental  (+) Loose, Implants,    Pulmonary neg sleep apnea, neg COPD, former smoker,    breath sounds clear to auscultation- rhonchi (-) wheezing      Cardiovascular Exercise Tolerance: Good hypertension, Pt. on medications (-) CAD, (-) Past MI and (-) Cardiac Stents  Rhythm:Regular Rate:Normal - Systolic murmurs and - Diastolic murmurs    Neuro/Psych Anxiety negative neurological ROS     GI/Hepatic Neg liver ROS, GERD  ,  Endo/Other  negative endocrine ROSneg diabetes  Renal/GU negative Renal ROS     Musculoskeletal  (+) Arthritis ,   Abdominal (+) + obese,   Peds  Hematology negative hematology ROS (+)   Anesthesia Other Findings Past Medical History: No date: Allergy     Comment:  Year-round allergies and sinus infections. No date: Anxiety No date: Arthritis     Comment:  Osteoarthritis in hips No date: Bursitis     Comment:  arms and neck, hips and left shoulder No date: Carpal tunnel syndrome on both sides No date: Facet syndrome, lumbar     Comment:  with bilateral sciatica. Sees Chiropractor in [place].  No date: GERD (gastroesophageal reflux disease) No date: History of genital warts     Comment:  Had D and C No date: Hyperlipidemia No date: Hypertension   Reproductive/Obstetrics                             Anesthesia Physical Anesthesia Plan  ASA: II  Anesthesia Plan: General   Post-op Pain Management:    Induction: Intravenous  PONV Risk Score and Plan: 2 and Propofol infusion  Airway Management Planned: Natural  Airway  Additional Equipment:   Intra-op Plan:   Post-operative Plan:   Informed Consent: I have reviewed the patients History and Physical, chart, labs and discussed the procedure including the risks, benefits and alternatives for the proposed anesthesia with the patient or authorized representative who has indicated his/her understanding and acceptance.   Dental advisory given  Plan Discussed with: CRNA and Anesthesiologist  Anesthesia Plan Comments:         Anesthesia Quick Evaluation

## 2017-03-08 NOTE — H&P (Signed)
Vonda Antigua, MD [address], [place], [place], [place], [address] [address], [place], [place], [place], [address] Phone: [phone]  Fax: [phone]  Primary Care Physician:  Roselee Nova, MD   Pre-Procedure History & Physical: HPI:  Morgan Gordon is a 46 y.o. female is here for an EGD.   Past Medical History:  Diagnosis Date  . Allergy    Year-round allergies and sinus infections.  . Anxiety   . Arthritis    Osteoarthritis in hips  . Bursitis    arms and neck, hips and left shoulder  . Carpal tunnel syndrome on both sides   . Facet syndrome, lumbar    with bilateral sciatica. Sees Chiropractor in [place].   Marland Kitchen GERD (gastroesophageal reflux disease)   . History of genital warts    Had D and C  . Hyperlipidemia   . Hypertension     Past Surgical History:  Procedure Laterality Date  . LESION EXCISION     genital warts  . TUBAL LIGATION  1997    Prior to Admission medications   Medication Sig Start Date End Date Taking? Authorizing Provider  Ascorbic Acid (VITAMIN C) 1000 MG tablet Take 1 tablet daily by mouth.   Yes [provider]  aspirin 81 MG tablet Take 1 tablet (81 mg total) by mouth daily. (take at least one hour BEFORE the meloxicam) 01/18/17  Yes Lada, Satira Anis, MD  busPIRone (BUSPAR) 30 MG tablet Take 1 tablet (30 mg total) 2 (two) times daily by mouth. 02/20/17  Yes Roselee Nova, MD  lovastatin (MEVACOR) 40 MG tablet TAKE 1 TABLET EACH EVENING 11/08/16  Yes Keith Rake Asad A, MD  meloxicam (MOBIC) 15 MG tablet TAKE 1 TABLET (15 MG TOTAL) BY MOUTH ONCE DAILY. 03/21/16  Yes [provider]  montelukast (SINGULAIR) 10 MG tablet TAKE 1 TABLET BY MOUTH EVERY DAY 12/17/16  Yes Keith Rake Asad A, MD  pantoprazole (PROTONIX) 20 MG tablet Take 1 tablet (20 mg total) daily by mouth. 02/20/17  Yes Rochel Brome A, MD  ramipril (ALTACE) 10 MG capsule TAKE 1 CAPSULE (10 MG TOTAL) BY MOUTH EVERY MORNING. 01/21/17  Yes Roselee Nova, MD  fluticasone (FLONASE) 50 MCG/ACT nasal spray SPRAY 2 SPRAYS INTO EACH NOSTRIL EVERY DAY 03/04/17   Roselee Nova, MD  levonorgestrel-ethinyl estradiol (SEASONALE,INTROVALE,JOLESSA) 0.15-0.03 MG tablet Take 1 tablet by mouth daily. 12/24/16   [provider]    Allergies as of 03/04/2017 - Review Complete 03/04/2017  Allergen Reaction Noted  . Codeine Hives 03/24/2015  . Penicillins Hives 03/24/2015  . Krill oil Rash 03/24/2015    Family History  Problem Relation Age of Onset  . Thyroid disease Mother   . Cancer Mother        breast  . Diabetes Mother   . Cancer Father        lymph node cancer  . Diabetes Father   . Diabetes Brother   . Thyroid disease Brother   . Colon cancer Maternal Grandmother     Social History   Socioeconomic History  . Marital status: Married    Spouse name: Not on file  . Number of children: Not on file  . Years of education: Not on file  . Highest education level: Not on file  Social Needs  . Financial resource strain: Not on file  . Food insecurity - worry: Not on file  . Food insecurity - inability: Not  on file  . Transportation needs - medical: Not on file  . Transportation needs - non-medical: Not on file  Occupational History  . Not on file  Tobacco Use  . Smoking status: Former Smoker    Packs/day: 0.50    Types: Cigarettes    Last attempt to quit: 06/24/2013    Years since quitting: 3.7  . Smokeless tobacco: Never Used  Substance and Sexual Activity  . Alcohol use: No  . Drug use: No  . Sexual activity: Yes  Other Topics Concern  . Not on file  Social History Narrative  . Not on file    Review of Systems: See HPI, otherwise negative ROS  Physical Exam: BP (!) 122/93   Pulse (!) 113   Temp 97.9 F (36.6 C) (Tympanic)   Resp 20   Ht 5\' 4"  (1.626 m)   Wt 87.5 kg (193 lb)   LMP 12/23/2016   SpO2 96%   BMI 33.13 kg/m  General:   Alert,  pleasant and cooperative in NAD Head:  Normocephalic  and atraumatic. Neck:  Supple; no masses or thyromegaly. Lungs:  Clear throughout to auscultation, normal respiratory effort.    Heart:  +S1, +S2, Regular rate and rhythm, No edema. Abdomen:  Soft, nontender and nondistended. Normal bowel sounds, without guarding, and without rebound.   Neurologic:  Alert and  oriented x4;  grossly normal neurologically.  Impression/Plan: Morgan Gordon is here for an EGD to be performed foracid reflux, dysphagia Risks, benefits, limitations, and alternatives regarding  colonoscopy have been reviewed with the patient.  Questions have been answered.  All parties agreeable.   Virgel Manifold, MD  03/08/2017, 1:19 PM

## 2017-03-08 NOTE — Transfer of Care (Signed)
Immediate Anesthesia Transfer of Care Note  Patient: Morgan Gordon  Procedure(s) Performed: ESOPHAGOGASTRODUODENOSCOPY (EGD) WITH PROPOFOL (N/A )  Patient Location: PACU and Endoscopy Unit  Anesthesia Type:General  Level of Consciousness: sedated  Airway & Oxygen Therapy: Patient Spontanous Breathing and Patient connected to nasal cannula oxygen  Post-op Assessment: Report given to RN and Post -op Vital signs reviewed and stable  Post vital signs: Reviewed and stable  Last Vitals:  Vitals:   03/08/17 1301  BP: (!) 122/93  Pulse: (!) 113  Resp: 20  Temp: 36.6 C  SpO2: 96%    Last Pain:  Vitals:   03/08/17 1301  TempSrc: Tympanic      Patients Stated Pain Goal: 0 (11/57/26 2035)  Complications: No apparent anesthesia complications

## 2017-03-11 ENCOUNTER — Encounter: Payer: Self-pay | Admitting: Gastroenterology

## 2017-03-13 LAB — SURGICAL PATHOLOGY

## 2017-03-14 ENCOUNTER — Ambulatory Visit: Payer: BLUE CROSS/BLUE SHIELD | Admitting: Anesthesiology

## 2017-03-14 ENCOUNTER — Ambulatory Visit
Admission: RE | Admit: 2017-03-14 | Discharge: 2017-03-14 | Disposition: A | Discharge status: Home / Self Care | Payer: BLUE CROSS/BLUE SHIELD | Source: Ambulatory Visit | Attending: Otolaryngology | Admitting: Otolaryngology

## 2017-03-14 ENCOUNTER — Encounter: Payer: Self-pay | Admitting: Gastroenterology

## 2017-03-14 ENCOUNTER — Encounter
Admission: RE | Disposition: A | Discharge status: Home / Self Care | Payer: Self-pay | Source: Ambulatory Visit | Attending: Otolaryngology

## 2017-03-14 DIAGNOSIS — K219 Gastro-esophageal reflux disease without esophagitis: Secondary | ICD-10-CM | POA: Diagnosis not present

## 2017-03-14 DIAGNOSIS — J343 Hypertrophy of nasal turbinates: Secondary | ICD-10-CM | POA: Diagnosis not present

## 2017-03-14 DIAGNOSIS — F419 Anxiety disorder, unspecified: Secondary | ICD-10-CM | POA: Insufficient documentation

## 2017-03-14 DIAGNOSIS — Z87891 Personal history of nicotine dependence: Secondary | ICD-10-CM | POA: Insufficient documentation

## 2017-03-14 DIAGNOSIS — I10 Essential (primary) hypertension: Secondary | ICD-10-CM | POA: Insufficient documentation

## 2017-03-14 DIAGNOSIS — J342 Deviated nasal septum: Secondary | ICD-10-CM | POA: Diagnosis not present

## 2017-03-14 HISTORY — DX: Gastro-esophageal reflux disease without esophagitis: K21.9

## 2017-03-14 HISTORY — PX: TURBINATE RESECTION: SHX6158

## 2017-03-14 HISTORY — DX: Carpal tunnel syndrome, bilateral upper limbs: G56.03

## 2017-03-14 HISTORY — PX: SEPTOPLASTY: SHX2393

## 2017-03-14 SURGERY — SEPTOPLASTY, NOSE
Anesthesia: General

## 2017-03-14 MED ORDER — LIDOCAINE-EPINEPHRINE 1 %-1:100000 IJ SOLN
INTRAMUSCULAR | Status: DC | PRN
  Administered 2017-03-14: 4.5 mL

## 2017-03-14 MED ORDER — ONDANSETRON HCL 4 MG/2ML IJ SOLN: 4.0000 mg | Freq: Once | INTRAMUSCULAR | Status: DC | PRN

## 2017-03-14 MED ORDER — OXYMETAZOLINE HCL 0.05 % NA SOLN
2.0000 | Freq: Once | NASAL | Status: AC
  Administered 2017-03-14: 2 via NASAL

## 2017-03-14 MED ORDER — DEXTROSE 5 % IV SOLN
600.0000 mg | Freq: Once | INTRAVENOUS | Status: AC
  Administered 2017-03-14: 600 mg via INTRAVENOUS

## 2017-03-14 MED ORDER — OXYCODONE HCL 5 MG/5ML PO SOLN: 5.0000 mg | Freq: Once | ORAL | Status: AC | PRN

## 2017-03-14 MED ORDER — LACTATED RINGERS IV SOLN
INTRAVENOUS | Status: DC
  Administered 2017-03-14: 07:00:00 via INTRAVENOUS

## 2017-03-14 MED ORDER — FENTANYL CITRATE (PF) 100 MCG/2ML IJ SOLN
INTRAMUSCULAR | Status: DC | PRN
  Administered 2017-03-14: 100 ug via INTRAVENOUS

## 2017-03-14 MED ORDER — MIDAZOLAM HCL 5 MG/5ML IJ SOLN
INTRAMUSCULAR | Status: DC | PRN
  Administered 2017-03-14: 2 mg via INTRAVENOUS

## 2017-03-14 MED ORDER — PROPOFOL 10 MG/ML IV BOLUS
INTRAVENOUS | Status: DC | PRN
  Administered 2017-03-14: 150 mg via INTRAVENOUS

## 2017-03-14 MED ORDER — PHENYLEPHRINE HCL 0.5 % NA SOLN
NASAL | Status: DC | PRN
  Administered 2017-03-14: 30 mL via TOPICAL

## 2017-03-14 MED ORDER — ONDANSETRON HCL 4 MG/2ML IJ SOLN
INTRAMUSCULAR | Status: DC | PRN
  Administered 2017-03-14: 4 mg via INTRAVENOUS

## 2017-03-14 MED ORDER — DEXAMETHASONE SODIUM PHOSPHATE 4 MG/ML IJ SOLN
INTRAMUSCULAR | Status: DC | PRN
  Administered 2017-03-14: 10 mg via INTRAVENOUS

## 2017-03-14 MED ORDER — LIDOCAINE HCL (CARDIAC) 20 MG/ML IV SOLN
INTRAVENOUS | Status: DC | PRN
  Administered 2017-03-14: 50 mg via INTRAVENOUS

## 2017-03-14 MED ORDER — OXYCODONE HCL 5 MG PO TABS: 5.0000 mg | ORAL_TABLET | Freq: Once | ORAL | Status: DC | PRN

## 2017-03-14 MED ORDER — FENTANYL CITRATE (PF) 100 MCG/2ML IJ SOLN: 25.0000 ug | INTRAMUSCULAR | Status: DC | PRN

## 2017-03-14 MED ORDER — OXYCODONE HCL 5 MG/5ML PO SOLN: 5.0000 mg | Freq: Once | ORAL | Status: DC | PRN

## 2017-03-14 MED ORDER — ACETAMINOPHEN 10 MG/ML IV SOLN
1000.0000 mg | Freq: Once | INTRAVENOUS | Status: AC
  Administered 2017-03-14: 1000 mg via INTRAVENOUS

## 2017-03-14 MED ORDER — ACETAMINOPHEN 10 MG/ML IV SOLN: 1000.0000 mg | Freq: Once | INTRAVENOUS | Status: DC | PRN

## 2017-03-14 MED ORDER — SCOPOLAMINE 1 MG/3DAYS TD PT72
1.0000 | MEDICATED_PATCH | Freq: Once | TRANSDERMAL | Status: DC
  Administered 2017-03-14: 1.5 mg via TRANSDERMAL

## 2017-03-14 MED ORDER — LACTATED RINGERS IV SOLN: INTRAVENOUS | Status: DC

## 2017-03-14 MED ORDER — OXYCODONE HCL 5 MG PO TABS
5.0000 mg | ORAL_TABLET | Freq: Once | ORAL | Status: AC | PRN
  Administered 2017-03-14: 5 mg via ORAL

## 2017-03-14 MED ORDER — SUCCINYLCHOLINE CHLORIDE 20 MG/ML IJ SOLN
INTRAMUSCULAR | Status: DC | PRN
  Administered 2017-03-14: 100 mg via INTRAVENOUS

## 2017-03-14 SURGICAL SUPPLY — 29 items
CANISTER SUCT 1200ML W/VALVE (MISCELLANEOUS) ×4 IMPLANT
CATH IV 18X1 1/4 SAFELET (CATHETERS) IMPLANT
COAGULATOR SUCT 8FR VV (MISCELLANEOUS) ×4 IMPLANT
DRAPE HEAD BAR (DRAPES) ×4 IMPLANT
ELECT REM PT RETURN 9FT ADLT (ELECTROSURGICAL) ×4
ELECTRODE REM PT RTRN 9FT ADLT (ELECTROSURGICAL) ×2 IMPLANT
GLOVE PI ULTRA LF STRL 7.5 (GLOVE) ×4 IMPLANT
GLOVE PI ULTRA NON LATEX 7.5 (GLOVE) ×4
IV CATH 18X1 1/4 SAFELET (CATHETERS)
KIT ROOM TURNOVER OR (KITS) ×4 IMPLANT
NEEDLE ANESTHESIA  27G X 3.5 (NEEDLE) ×2
NEEDLE ANESTHESIA 27G X 3.5 (NEEDLE) ×2 IMPLANT
NS IRRIG 500ML POUR BTL (IV SOLUTION) ×4 IMPLANT
PACK DRAPE NASAL/ENT (PACKS) ×4 IMPLANT
PACKING NASAL EPIS 4X2.4 XEROG (MISCELLANEOUS) ×4 IMPLANT
PAD GROUND ADULT SPLIT (MISCELLANEOUS) ×4 IMPLANT
PATTIES SURGICAL .5 X3 (DISPOSABLE) ×4 IMPLANT
SOL ANTI-FOG 6CC FOG-OUT (MISCELLANEOUS) ×2 IMPLANT
SOL FOG-OUT ANTI-FOG 6CC (MISCELLANEOUS) ×2
SPLINT NASAL SEPTAL BLV .50 ST (MISCELLANEOUS) ×4 IMPLANT
STRAP BODY AND KNEE 60X3 (MISCELLANEOUS) ×4 IMPLANT
SUT CHROMIC 3-0 (SUTURE) ×2
SUT CHROMIC 3-0 KS 27XMFL CR (SUTURE) ×2
SUT ETHILON 3-0 KS 30 BLK (SUTURE) ×4 IMPLANT
SUT PLAIN GUT 4-0 (SUTURE) ×4 IMPLANT
SUTURE CHRMC 3-0 KS 27XMFL CR (SUTURE) ×2 IMPLANT
SYR 3ML LL SCALE MARK (SYRINGE) ×4 IMPLANT
TOWEL OR 17X26 4PK STRL BLUE (TOWEL DISPOSABLE) ×4 IMPLANT
WATER STERILE IRR 250ML POUR (IV SOLUTION) ×4 IMPLANT

## 2017-03-14 NOTE — Anesthesia Procedure Notes (Addendum)
Procedure Name: Intubation Date/Time: 03/14/2017 7:39 AM Performed by: Londell Moh, CRNA Pre-anesthesia Checklist: Patient identified, Emergency Drugs available, Suction available, Patient being monitored and Timeout performed Patient Re-evaluated:Patient Re-evaluated prior to induction Oxygen Delivery Method: Circle system utilized Preoxygenation: Pre-oxygenation with 100% oxygen Induction Type: IV induction Ventilation: Mask ventilation without difficulty Laryngoscope Size: Mac and 3 Grade View: Grade I Tube type: Oral Rae Tube size: 7.0 mm Number of attempts: 1 Placement Confirmation: ETT inserted through vocal cords under direct vision,  positive ETCO2 and breath sounds checked- equal and bilateral Tube secured with: Tape Dental Injury: Teeth and Oropharynx as per pre-operative assessment  Comments: Patient with loose upper implanted tooth prior to intubation.Tooth intact/unchanged.

## 2017-03-14 NOTE — Discharge Instructions (Signed)
[facility] °[facility] °ENDOSCOPIC SINUS SURGERY °[facility] ° °What is Functional Endoscopic Sinus Surgery? ° The Surgery involves making the natural openings of the sinuses larger by removing the bony partitions that separate the sinuses from the nasal cavity.  The natural sinus lining is preserved as much as possible to allow the sinuses to resume normal function after the surgery.  In some patients nasal polyps (excessively swollen lining of the sinuses) may be removed to relieve obstruction of the sinus openings.  The surgery is performed through the nose using lighted scopes, which eliminates the need for incisions on the face.  A septoplasty is a different procedure which is sometimes performed with sinus surgery.  It involves straightening the boy partition that separates the two sides of your nose.  A crooked or deviated septum may need repair if is obstructing the sinuses or nasal airflow.  Turbinate reduction is also often performed during sinus surgery.  The turbinates are bony proturberances from the side walls of the nose which swell and can obstruct the nose in patients with sinus and allergy problems.  Their size can be surgically reduced to help relieve nasal obstruction. ° °What Can Sinus Surgery Do For Me? ° Sinus surgery can reduce the frequency of sinus infections requiring antibiotic treatment.  This can provide improvement in nasal congestion, post-nasal drainage, facial pressure and nasal obstruction.  Surgery will NOT prevent you from ever having an infection again, so it usually only for patients who get infections 4 or more times yearly requiring antibiotics, or for infections that do not clear with antibiotics.  It will not cure nasal allergies, so patients with allergies may still require medication to treat their allergies after surgery. Surgery may improve headaches related to sinusitis, however, some people will continue to  require medication to control sinus headaches related to allergies.  Surgery will do nothing for other forms of headache (migraine, tension or cluster). ° °What Are the Risks of Endoscopic Sinus Surgery? ° Current techniques allow surgery to be performed safely with little risk, however, there are rare complications that patients should be aware of.  Because the sinuses are located around the eyes, there is risk of eye injury, including blindness, though again, this would be quite rare. This is usually a result of bleeding behind the eye during surgery, which puts the vision oat risk, though there are treatments to protect the vision and prevent permanent disrupted by surgery causing a leak of the spinal fluid that surrounds the brain.  More serious complications would include bleeding inside the brain cavity or damage to the brain.  Again, all of these complications are uncommon, and spinal fluid leaks can be safely managed surgically if they occur.  The most common complication of sinus surgery is bleeding from the nose, which may require packing or cauterization of the nose.  Continued sinus have polyps may experience recurrence of the polyps requiring revision surgery.  Alterations of sense of smell or injury to the tear ducts are also rare complications.  ° °What is the Surgery Like, and what is the Recovery? ° The Surgery usually takes a couple of hours to perform, and is usually performed under a general anesthetic (completely asleep).  Patients are usually discharged home after a couple of hours.  Sometimes during surgery it is necessary to pack the nose to control bleeding, and the packing is left in place for 24 - 48 hours, and removed by your surgeon.    If a septoplasty was performed during the procedure, there is often a splint placed which must be removed after 5-7 days.   °Discomfort: Pain is usually mild to moderate, and can be controlled by prescription pain medication or acetaminophen (Tylenol).   Aspirin, Ibuprofen (Advil, Motrin), or Naprosyn (Aleve) should be avoided, as they can cause increased bleeding.  Most patients feel sinus pressure like they have a bad head cold for several days.  Sleeping with your head elevated can help reduce swelling and facial pressure, as can ice packs over the face.  A humidifier may be helpful to keep the mucous and blood from drying in the nose.  ° °Diet: There are no specific diet restrictions, however, you should generally start with clear liquids and a light diet of bland foods because the anesthetic can cause some nausea.  Advance your diet depending on how your stomach feels.  Taking your pain medication with food will often help reduce stomach upset which pain medications can cause. ° °Nasal Saline Irrigation: It is important to remove blood clots and dried mucous from the nose as it is healing.  This is done by having you irrigate the nose at least 3 - 4 times daily with a salt water solution.  We recommend using NeilMed Sinus Rinse (available at the drug store).  Fill the squeeze bottle with the solution, bend over a sink, and insert the tip of the squeeze bottle into the nose ½ of an inch.  Point the tip of the squeeze bottle towards the inside corner of the eye on the same side your irrigating.  Squeeze the bottle and gently irrigate the nose.  If you bend forward as you do this, most of the fluid will flow back out of the nose, instead of down your throat.   The solution should be warm, near body temperature, when you irrigate.   Each time you irrigate, you should use a full squeeze bottle.  ° °Note that if you are instructed to use Nasal Steroid Sprays at any time after your surgery, irrigate with saline BEFORE using the steroid spray, so you do not wash it all out of the nose. °Another product, Nasal Saline Gel (such as AYR Nasal Saline Gel) can be applied in each nostril 3 - 4 times daily to moisture the nose and reduce scabbing or crusting. ° °Bleeding:   Bloody drainage from the nose can be expected for several days, and patients are instructed to irrigate their nose frequently with salt water to help remove mucous and blood clots.  The drainage may be dark red or brown, though some fresh blood may be seen intermittently, especially after irrigation.  Do not blow you nose, as bleeding may occur. If you must sneeze, keep your mouth open to allow air to escape through your mouth. ° °If heavy bleeding occurs: Irrigate the nose with saline to rinse out clots, then spray the nose 3 - 4 times with Afrin Nasal Decongestant Spray.  The spray will constrict the blood vessels to slow bleeding.  Pinch the lower half of your nose shut to apply pressure, and lay down with your head elevated.  Ice packs over the nose may help as well. If bleeding persists despite these measures, you should notify your doctor.  Do not use the Afrin routinely to control nasal congestion after surgery, as it can result in worsening congestion and may affect healing.  ° ° ° °Activity: Return to work varies among patients. Most patients will be   out of work at least 5 - 7 days to recover.  Patient may return to work after they are off of narcotic pain medication, and feeling well enough to perform the functions of their job.  Patients must avoid heavy lifting (over 10 pounds) or strenuous physical for 2 weeks after surgery, so your employer may need to assign you to light duty, or keep you out of work longer if light duty is not possible.  NOTE: you should not drive, operate dangerous machinery, do any mentally demanding tasks or make any important legal or financial decisions while on narcotic pain medication and recovering from the general anesthetic.    Call Your Doctor Immediately if You Have Any of the Following: 1. Bleeding that you cannot control with the above measures 2. Loss of vision, double vision, bulging of the eye or black eyes. 3. Fever over 101 degrees 4. Neck stiffness with  severe headache, fever, nausea and change in mental state. You are always encourage to call anytime with concerns, however, please call with requests for pain medication refills during office hours.  Office Endoscopy: During follow-up visits your doctor will remove any packing or splints that may have been placed and evaluate and clean your sinuses endoscopically.  Topical anesthetic will be used to make this as comfortable as possible, though you may want to take your pain medication prior to the visit.  How often this will need to be done varies from patient to patient.  After complete recovery from the surgery, you may need follow-up endoscopy from time to time, particularly if there is concern of recurrent infection or nasal polyps.  Scopolamine skin patches REMOVE PATCH IN 72 HOURS AND Ginger Blue HANDS IMMEDIATELY What is this medicine? SCOPOLAMINE (skoe POL a meen) is used to prevent nausea and vomiting caused by motion sickness, anesthesia and surgery. This medicine may be used for other purposes; ask your health care provider or pharmacist if you have questions. COMMON BRAND NAME(S): Transderm Scop What should I tell my health care provider before I take this medicine? They need to know if you have any of these conditions: -glaucoma -kidney or liver disease -an unusual or allergic reaction (especially skin allergy) to scopolamine, atropine, other medicines, foods, dyes, or preservatives -pregnant or trying to get pregnant -breast-feeding How should I use this medicine? This medicine is for external use only. Follow the directions on the prescription label. One patch contains enough medicine to prevent motion sickness for up to 3 days. Apply the patch at least 4 hours before you need it and only wear one disc at a time. Choose an area behind the ear, that is clean, dry, hairless and free from any cuts or irritation. Wipe the area with a clean dry tissue. Peel off the plastic backing of the skin  patch, trying not to touch the adhesive side with your hands. Do not cut the patches. Firmly apply to the area you have chosen, with the metallic side of the patch to the skin and the tan-colored side showing. Once firmly in place, wash your hands well with soap and water. Remove the disc after 3 days, or sooner if you no longer need it. After removing the patch, wash your hands and the area behind your ear thoroughly with soap and water. The patch will still contain some medicine after use. To avoid accidental contact or ingestion by children or pets, fold the used patch in half with the sticky side together and throw away in the trash  out of the reach of children and pets. If you need to use a second patch after you remove the first, place it behind the other ear. Talk to your pediatrician regarding the use of this medicine in children. Special care may be needed. Overdosage: If you think you have taken too much of this medicine contact a poison control center or emergency room at once. NOTE: This medicine is only for you. Do not share this medicine with others. What if I miss a dose? Make sure you apply the patch at least 4 hours before you need it. You can apply it the night before traveling. What may interact with this medicine? -benztropine -bethanechol -medicines for anxiety or sleeping problems like diazepam or temazepam -medicines for hay fever and other allergies -medicines for mental depression -muscle relaxants This list may not describe all possible interactions. Give your health care provider a list of all the medicines, herbs, non-prescription drugs, or dietary supplements you use. Also tell them if you smoke, drink alcohol, or use illegal drugs. Some items may interact with your medicine. What should I watch for while using this medicine? Keep the patch dry, if possible, to prevent it from falling off. Limited contact with water, however, as in bathing or swimming, will not affect the  system. If the patch falls off, throw it away and put a new one behind the other ear. You may get drowsy or dizzy. Do not drive, use machinery, or do anything that needs mental alertness until you know how this medicine affects you. Do not stand or sit up quickly, especially if you are an older patient. This reduces the risk of dizzy or fainting spells. Alcohol may interfere with the effect of this medicine. Avoid alcoholic drinks. Your mouth may get dry. Chewing sugarless gum or sucking hard candy, and drinking plenty of water may help. Contact your doctor if the problem does not go away or is severe. This medicine may cause dry eyes and blurred vision. If you wear contact lenses you may feel some discomfort. Lubricating drops may help. See your eye doctor if the problem does not go away or is severe. If you are going to have a magnetic resonance imaging (MRI) procedure, tell your MRI technician if you have this patch on your body. It must be removed before a MRI. What side effects may I notice from receiving this medicine? Side effects that you should report to your doctor or health care professional as soon as possible: -agitation, nervousness, confusion -blurred vision and other eye problems -dizziness, drowsiness -eye pain or redness in the whites of the eye -hallucinations -pain or difficulty passing urine -skin rash, itching -vomiting Side effects that usually do not require medical attention (report to your doctor or health care professional if they continue or are bothersome): -headache -nausea This list may not describe all possible side effects. Call your doctor for medical advice about side effects. You may report side effects to FDA at 1-800-FDA-1088. Where should I keep my medicine? Keep out of the reach of children. Store at room temperature between 20 and 25 degrees C (68 and 77 degrees F). Throw away any unused medicine after the expiration date. When you remove a patch, fold it  and throw it in the trash as described above. NOTE: This sheet is a summary. It may not cover all possible information. If you have questions about this medicine, talk to your doctor, pharmacist, or health care provider.  2018 Elsevier/Gold Standard (2011-08-23 13:31:48)  General Anesthesia, Adult, Care After These instructions provide you with information about caring for yourself after your procedure. Your health care provider may also give you more specific instructions. Your treatment has been planned according to current medical practices, but problems sometimes occur. Call your health care provider if you have any problems or questions after your procedure. What can I expect after the procedure? After the procedure, it is common to have:  Vomiting.  A sore throat.  Mental slowness.  It is common to feel:  Nauseous.  Cold or shivery.  Sleepy.  Tired.  Sore or achy, even in parts of your body where you did not have surgery.  Follow these instructions at home: For at least 24 hours after the procedure:  Do not: ? Participate in activities where you could fall or become injured. ? Drive. ? Use heavy machinery. ? Drink alcohol. ? Take sleeping pills or medicines that cause drowsiness. ? Make important decisions or sign legal documents. ? Take care of children on your own.  Rest. Eating and drinking  If you vomit, drink water, juice, or soup when you can drink without vomiting.  Drink enough fluid to keep your urine clear or pale yellow.  Make sure you have little or no nausea before eating solid foods.  Follow the diet recommended by your health care provider. General instructions  Have a responsible adult stay with you until you are awake and alert.  Return to your normal activities as told by your health care provider. Ask your health care provider what activities are safe for you.  Take over-the-counter and prescription medicines only as told by your  health care provider.  If you smoke, do not smoke without supervision.  Keep all follow-up visits as told by your health care provider. This is important. Contact a health care provider if:  You continue to have nausea or vomiting at home, and medicines are not helpful.  You cannot drink fluids or start eating again.  You cannot urinate after 8-12 hours.  You develop a skin rash.  You have fever.  You have increasing redness at the site of your procedure. Get help right away if:  You have difficulty breathing.  You have chest pain.  You have unexpected bleeding.  You feel that you are having a life-threatening or urgent problem. This information is not intended to replace advice given to you by your health care provider. Make sure you discuss any questions you have with your health care provider. Document Released: 07/02/2000 Document Revised: 08/29/2015 Document Reviewed: 03/10/2015 Elsevier Interactive Patient Education  [employer].

## 2017-03-14 NOTE — Op Note (Signed)
03/14/2017  8:28 AM [id number]   Pre-Op Dx:  Deviated Nasal Septum, Hypertrophic Inferior Turbinates  Post-op Dx: Same  Proc: Nasal Septoplasty, Out fracture Inferior Turbinates   Surg:  [facility]  Anes:  GOT  EBL:  50 mL  Comp:  None  Findings: Septum deviated to the right superiorly at the ethmoid plate and a large vomer spur inferiorly to the left.  Procedure: With the patient in a comfortable supine position,  general orotracheal anesthesia was induced without difficulty.     The patient received preoperative Afrin spray for topical decongestion and vasoconstriction.  Intravenous prophylactic antibiotics were administered.  At an appropriate level, the patient was placed in a semi-sitting position.  Nasal vibrissae were trimmed.   1% Xylocaine with 1:100,000 epinephrine, 5 cc's, was infiltrated into the anterior floor of the nose, into the nasal spine region, into the membranous columella, and finally into the submucoperichondrial plane of the septum on both sides.  Several minutes were allowed for this to take effect.  Cottoniod pledgetts soaked in Afrin and 4% Xylocaine were placed into both nasal cavities and left while the patient was prepped and draped in the standard fashion.  The materials were removed from the nose and observed to be intact and correct in number.  The nose was inspected with a headlight and the 0 scope with the findings as described above.  A left Killian incision was sharply executed and carried down to the quadrangular cartilage. The mucoperichondrium was elelvated along the quadrangular plate back to the bony-cartilaginous junction. The mucoperiostium was then elevated along the ethmoid plate and the vomer. The boney-catilaginous junction was then split with a freer elevator and the mucoperiosteum was elevated on the opposite side. The mucoperiosteum was then elevated along the maxillary crest as needed to expose the crooked bone of the crest.  Boney  spurs of the vomer and maxillary crest were removed with Donavan Foil forceps.  The cartilaginous plate was trimmed along its posterior and inferior borders of about 2 mm of cartilage to free it up inferiorly. The cartilage overhanging the left maxillary crest was removed. Some of the deviated ethmoid plate was then fractured and removed with Takahashi forceps to free up the posterior border of the quadrangular plate and allow it to swing back to the midline. The mucosal flaps were placed back into their anatomic position to allow visualization of the airways. The septum now sat in the midline with an improved airway.  A 3-0 Chromic suture on a Keith needle in used to anchor the inferior septum at the nasal spine with a through and through suture. The mucosal flaps are then sutured together using a through and through whip stitch of 4-0 Plain Gut  a mini-Keith needle. This was used to close the [place] incision as well.   The inferior turbinates were then inspected. The left inferior turbinate was enlarged and a [place] elevator was used for outfracturing the inferior turbinate. This opened up the airway on the left side. The right turbinate was then outfractured in a similar fashion to open the airway on the right side.  The airways were then visualized and showed open passageways on both sides that were significantly improved compared to before surgery. There was no signifcant bleeding. Nasal splints were applied to both sides of the septum using Xomed 0.48mm regular sized splints that were trimmed, and then held in position with a 3-0 Nylon through and through suture.  The patient was turned back over to anesthesia, and  awakened, extubated, and taken to the PACU in satisfactory condition.  Dispo:   PACU to home  Plan: Ice, elevation, narcotic analgesia, steroid taper, and prophylactic antibiotics for the duration of indwelling nasal foreign bodies.  We will reevaluate the patient in the office in 6 days  and remove the septal splints.  Return to work in 10 days, strenuous activities in two weeks.   [facility] 03/14/2017 8:28 AM

## 2017-03-14 NOTE — Transfer of Care (Signed)
Immediate Anesthesia Transfer of Care Note  Patient: [facility]  Procedure(s) Performed: SEPTOPLASTY (N/A ) BILATERAL INFERIOR TURBINATE OUT-FRACTURE (Bilateral )  Patient Location: PACU  Anesthesia Type: General ETT  Level of Consciousness: awake, alert  and patient cooperative  Airway and Oxygen Therapy: Patient Spontanous Breathing and Patient connected to supplemental oxygen  Post-op Assessment: Post-op Vital signs reviewed, Patient's Cardiovascular Status Stable, Respiratory Function Stable, Patent Airway and No signs of Nausea or vomiting  Post-op Vital Signs: Reviewed and stable  Complications: No apparent anesthesia complications

## 2017-03-14 NOTE — Anesthesia Preprocedure Evaluation (Addendum)
Anesthesia Evaluation  Patient identified by MRN, date of birth, ID band Patient awake    Reviewed: Allergy & Precautions, H&P , NPO status , Patient's Chart, lab work & pertinent test results  Airway Mallampati: II  TM Distance: >3 FB Neck ROM: full    Dental  (+) Loose   Pulmonary former smoker,    Pulmonary exam normal breath sounds clear to auscultation       Cardiovascular hypertension, Normal cardiovascular exam Rhythm:regular Rate:Normal     Neuro/Psych Anxiety    GI/Hepatic GERD  ,  Endo/Other    Renal/GU      Musculoskeletal   Abdominal   Peds  Hematology   Anesthesia Other Findings   Reproductive/Obstetrics                            Anesthesia Physical Anesthesia Plan  ASA: II  Anesthesia Plan: General ETT   Post-op Pain Management:    Induction:   PONV Risk Score and Plan: 3 and Scopolamine patch - Pre-op, Ondansetron and Dexamethasone  Airway Management Planned:   Additional Equipment:   Intra-op Plan:   Post-operative Plan:   Informed Consent: I have reviewed the patients History and Physical, chart, labs and discussed the procedure including the risks, benefits and alternatives for the proposed anesthesia with the patient or authorized representative who has indicated his/her understanding and acceptance.     Plan Discussed with: CRNA  Anesthesia Plan Comments:         Anesthesia Quick Evaluation

## 2017-03-14 NOTE — Anesthesia Postprocedure Evaluation (Signed)
Anesthesia Post Note  Patient: [facility]  Procedure(s) Performed: SEPTOPLASTY (N/A ) BILATERAL INFERIOR TURBINATE OUT-FRACTURE (Bilateral )  Patient location during evaluation: PACU Anesthesia Type: General Level of consciousness: awake and alert and oriented Pain management: satisfactory to patient Vital Signs Assessment: post-procedure vital signs reviewed and stable Respiratory status: spontaneous breathing, nonlabored ventilation and respiratory function stable Cardiovascular status: blood pressure returned to baseline and stable Postop Assessment: Adequate PO intake and No signs of nausea or vomiting Anesthetic complications: no    Raliegh Ip

## 2017-03-14 NOTE — H&P (Signed)
H&P has been reviewedand patient reevaluated,  and no changes necessary. To be downloaded later.  

## 2017-03-15 ENCOUNTER — Other Ambulatory Visit: Payer: Self-pay | Admitting: Family Medicine

## 2017-03-15 DIAGNOSIS — J3089 Other allergic rhinitis: Secondary | ICD-10-CM

## 2017-03-16 ENCOUNTER — Encounter: Payer: Self-pay | Admitting: Otolaryngology

## 2017-03-19 DIAGNOSIS — Z48813 Encounter for surgical aftercare following surgery on the respiratory system: Secondary | ICD-10-CM | POA: Diagnosis not present

## 2017-04-03 DIAGNOSIS — Z48813 Encounter for surgical aftercare following surgery on the respiratory system: Secondary | ICD-10-CM | POA: Diagnosis not present

## 2017-04-21 ENCOUNTER — Other Ambulatory Visit: Payer: Self-pay | Admitting: Family Medicine

## 2017-04-21 DIAGNOSIS — I10 Essential (primary) hypertension: Secondary | ICD-10-CM

## 2017-04-25 ENCOUNTER — Other Ambulatory Visit: Payer: Self-pay

## 2017-04-25 DIAGNOSIS — J0100 Acute maxillary sinusitis, unspecified: Secondary | ICD-10-CM

## 2017-04-25 MED ORDER — FLUTICASONE PROPIONATE 50 MCG/ACT NA SUSP: NASAL | 2 refills | Status: DC

## 2017-05-28 DIAGNOSIS — I10 Essential (primary) hypertension: Secondary | ICD-10-CM | POA: Diagnosis not present

## 2017-05-28 DIAGNOSIS — R002 Palpitations: Secondary | ICD-10-CM | POA: Diagnosis not present

## 2017-05-28 DIAGNOSIS — E669 Obesity, unspecified: Secondary | ICD-10-CM | POA: Diagnosis not present

## 2017-05-29 ENCOUNTER — Other Ambulatory Visit: Payer: Self-pay

## 2017-05-29 ENCOUNTER — Encounter: Payer: Self-pay | Admitting: Gastroenterology

## 2017-05-29 ENCOUNTER — Ambulatory Visit (INDEPENDENT_AMBULATORY_CARE_PROVIDER_SITE_OTHER): Payer: BLUE CROSS/BLUE SHIELD | Admitting: Gastroenterology

## 2017-05-29 VITALS — BP 121/77 | HR 66 | Ht 64.0 in | Wt 194.0 lb

## 2017-05-29 DIAGNOSIS — K219 Gastro-esophageal reflux disease without esophagitis: Secondary | ICD-10-CM

## 2017-05-29 NOTE — Progress Notes (Signed)
Vonda Antigua, MD [address]  [place]  [place], [place] [address]  Main: [phone]  Fax: [phone]   Primary Care Physician: Sallee Lange, NP  Primary Gastroenterologist:  Dr. Vonda Antigua  Chief Complaint  Patient presents with  . Follow-up    2 mo. f/u EGD    HPI: Morgan Gordon is a 47 y.o. female here for follow-up of acid reflux.  Patient was initially seen on March 04, 2017 and at that time had been on PPI for 2 weeks that were started by primary care provider and symptoms were already improving on the medication.  She underwent an EGD on March 08, 2017 due to symptoms of acid reflux and intermittent dysphagia.  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  Gastric erosion and mild gastric erythema was biopsied and did not show any H. pylori.  2 small salmon colored islands were seen in the distal esophagus and biopsies showed reflux gastroesophagitis.  Patient states symptoms are well controlled and she has not had any episodes of acid reflux or dysphagia since last presentation.  No abdominal pain.  No nausea vomiting.  No weight loss.  Good appetite.  She states she stopped taking Protonix about a month ago as it was causing diarrhea.  She is started taking Prilosec 20 mg a day instead.  She works at night and so goes to bed around 6-6:30 AM every day.  She takes her Prilosec right before going to bed.  Reports mild dyspepsia, only occurring at night intermittently.  Current Outpatient Medications  Medication Sig Dispense Refill  . Ascorbic Acid (VITAMIN C) 1000 MG tablet Take 1 tablet daily by mouth.    . busPIRone (BUSPAR) 30 MG tablet Take 1 tablet (30 mg total) 2 (two) times daily by mouth. 180 tablet 0  . fluticasone (FLONASE) 50 MCG/ACT nasal spray SPRAY 2 SPRAYS INTO EACH NOSTRIL EVERY DAY 16 g 2  . levonorgestrel-ethinyl estradiol  (SEASONALE,INTROVALE,JOLESSA) 0.15-0.03 MG tablet Take 1 tablet by mouth daily.  3  . lovastatin (MEVACOR) 40 MG tablet TAKE 1 TABLET EACH EVENING 90 tablet 1  . meloxicam (MOBIC) 15 MG tablet TAKE 1 TABLET (15 MG TOTAL) BY MOUTH ONCE DAILY.  1  . montelukast (SINGULAIR) 10 MG tablet TAKE 1 TABLET BY MOUTH EVERY DAY 90 tablet 0  . pantoprazole (PROTONIX) 20 MG tablet Take 1 tablet (20 mg total) daily by mouth. 90 tablet 0  . ramipril (ALTACE) 10 MG capsule TAKE 1 CAPSULE (10 MG TOTAL) BY MOUTH EVERY MORNING. 90 capsule 0   No current facility-administered medications for this visit.     Allergies as of 05/29/2017 - Review Complete 03/14/2017  Allergen Reaction Noted  . Codeine Hives 03/24/2015  . Gluten meal  03/06/2017  . Penicillins Hives 03/24/2015  . Krill oil Rash 03/24/2015    ROS:  General: Negative for anorexia, weight loss, fever, chills, fatigue, weakness. ENT: Negative for hoarseness, difficulty swallowing , nasal congestion. CV: Negative for chest pain, angina, palpitations, dyspnea on exertion, peripheral edema.  Respiratory: Negative for dyspnea at rest, dyspnea on exertion, cough, sputum, wheezing.  GI: See history of present illness. GU:  Negative for dysuria, hematuria, urinary incontinence, urinary frequency, nocturnal urination.  Endo: Negative for unusual weight change.    Physical Examination:   There were no vitals taken for this visit.  General: Well-nourished, well-developed in no acute distress.  Eyes: No icterus.  Conjunctivae pink. Mouth: Oropharyngeal mucosa moist and pink , no lesions erythema or exudate. Neck: Supple, Trachea midline Abdomen: Bowel sounds are normal, nontender, nondistended, no hepatosplenomegaly or masses, no abdominal bruits or hernia , no rebound or guarding.   Extremities: No lower extremity edema. No clubbing or deformities. Neuro: Alert and oriented x 3.  Grossly intact. Skin: Warm and dry, no jaundice.   Psych: Alert and  cooperative, normal mood and affect.   Labs: CMP     Component Value Date/Time   NA 138 10/05/2016 0825   K 4.1 10/05/2016 0825   CL 102 10/05/2016 0825   CO2 25 10/05/2016 0825   GLUCOSE 77 10/05/2016 0825   GLUCOSE 119 (H) 08/13/2016 2035   BUN 18 10/05/2016 0825   CREATININE 0.93 10/05/2016 0825   CALCIUM 9.2 10/05/2016 0825   PROT 6.7 10/05/2016 0825   ALBUMIN 4.4 10/05/2016 0825   AST 15 10/05/2016 0825   ALT 13 10/05/2016 0825   ALKPHOS 46 10/05/2016 0825   BILITOT 0.3 10/05/2016 0825   GFRNONAA 74 10/05/2016 0825   GFRAA 86 10/05/2016 0825   Lab Results  Component Value Date   WBC 14.3 (H) 08/13/2016   HGB 14.8 08/13/2016   HCT 44.3 08/13/2016   MCV 82.0 08/13/2016   PLT 366 08/13/2016    Imaging Studies: No results found.  Assessment and Plan:   RYIAN LYNDE is a 47 y.o. y/o female here for follow-up of acid reflux, and dysphagia which have resolved at this time  Continue acid reflux lifestyle modifications Elevate head of bed to 30 degrees at night Do not eat 3 hours before bedtime This was discussed with the patient again Since symptoms have improved, and patient has already discontinued PPI, and started Prilosec daily no further indication of repeat testing or changes in management.  Biopsies did not show any evidence of H. pylori. She can continue Prilosec daily I have asked her to institute lifestyle modifications as discussed above, and then discontinue Prilosec or only take it as needed after about another month if symptoms remain controlled.  However, I have discussed the risks of gastric ulcers with her meloxicam use that she uses for bursitis.  I have asked her to take this with food if possible.  If she notices any changes in her stool color, melena or hematochezia, weakness, hematemesis, or any other reason for concern she should call us or discuss this with her primary care provider or go to the ER if this occurs. She states she is not  taking any other NSAIDs besides meloxicam.  She states the meloxicam was given to her by pain management.  Denies any constipation or diarrhea.  Reports normal bowel movements without blood. Screening colonoscopy was offered to her since [place] recommend screening colonoscopy after 47 years of age.  However, she was also informed that other GI societies still recommend screening after 47 years of age.  She would like to hold off for now, and if she decides that she would like a screening colonoscopy in the near future she will inform us.  Primary care can discuss this with her over time as well.  Can continue follow-up with primary care physician Can follow-up in our clinic in 1 year   Dr Vonda Antigua

## 2017-06-13 DIAGNOSIS — L820 Inflamed seborrheic keratosis: Secondary | ICD-10-CM | POA: Diagnosis not present

## 2017-06-13 DIAGNOSIS — L814 Other melanin hyperpigmentation: Secondary | ICD-10-CM | POA: Diagnosis not present

## 2017-06-13 DIAGNOSIS — L720 Epidermal cyst: Secondary | ICD-10-CM | POA: Diagnosis not present

## 2017-06-13 DIAGNOSIS — D229 Melanocytic nevi, unspecified: Secondary | ICD-10-CM | POA: Diagnosis not present

## 2017-06-18 ENCOUNTER — Other Ambulatory Visit: Payer: Self-pay

## 2017-06-24 DIAGNOSIS — E669 Obesity, unspecified: Secondary | ICD-10-CM | POA: Diagnosis not present

## 2017-06-24 DIAGNOSIS — M7541 Impingement syndrome of right shoulder: Secondary | ICD-10-CM | POA: Diagnosis not present

## 2017-06-24 DIAGNOSIS — Z6832 Body mass index (BMI) 32.0-32.9, adult: Secondary | ICD-10-CM | POA: Diagnosis not present

## 2017-06-24 DIAGNOSIS — M7551 Bursitis of right shoulder: Secondary | ICD-10-CM | POA: Diagnosis not present

## 2017-07-18 DIAGNOSIS — E669 Obesity, unspecified: Secondary | ICD-10-CM | POA: Diagnosis not present

## 2017-07-18 DIAGNOSIS — M7541 Impingement syndrome of right shoulder: Secondary | ICD-10-CM | POA: Diagnosis not present

## 2017-07-18 DIAGNOSIS — M7551 Bursitis of right shoulder: Secondary | ICD-10-CM | POA: Diagnosis not present

## 2017-07-18 DIAGNOSIS — Z6832 Body mass index (BMI) 32.0-32.9, adult: Secondary | ICD-10-CM | POA: Diagnosis not present

## 2017-07-22 ENCOUNTER — Other Ambulatory Visit: Payer: Self-pay

## 2017-07-22 DIAGNOSIS — J0100 Acute maxillary sinusitis, unspecified: Secondary | ICD-10-CM

## 2017-07-24 DIAGNOSIS — M25511 Pain in right shoulder: Secondary | ICD-10-CM | POA: Diagnosis not present

## 2017-07-24 DIAGNOSIS — M25611 Stiffness of right shoulder, not elsewhere classified: Secondary | ICD-10-CM | POA: Diagnosis not present

## 2017-07-24 DIAGNOSIS — M7541 Impingement syndrome of right shoulder: Secondary | ICD-10-CM | POA: Diagnosis not present

## 2017-07-24 DIAGNOSIS — M6281 Muscle weakness (generalized): Secondary | ICD-10-CM | POA: Diagnosis not present

## 2017-07-29 DIAGNOSIS — M7541 Impingement syndrome of right shoulder: Secondary | ICD-10-CM | POA: Diagnosis not present

## 2017-07-30 DIAGNOSIS — R102 Pelvic and perineal pain: Secondary | ICD-10-CM | POA: Diagnosis not present

## 2017-07-30 DIAGNOSIS — Z8742 Personal history of other diseases of the female genital tract: Secondary | ICD-10-CM | POA: Diagnosis not present

## 2017-07-30 DIAGNOSIS — N921 Excessive and frequent menstruation with irregular cycle: Secondary | ICD-10-CM | POA: Diagnosis not present

## 2017-08-01 DIAGNOSIS — M7541 Impingement syndrome of right shoulder: Secondary | ICD-10-CM | POA: Diagnosis not present

## 2017-08-06 DIAGNOSIS — M7541 Impingement syndrome of right shoulder: Secondary | ICD-10-CM | POA: Diagnosis not present

## 2017-08-06 DIAGNOSIS — M25611 Stiffness of right shoulder, not elsewhere classified: Secondary | ICD-10-CM | POA: Diagnosis not present

## 2017-08-06 DIAGNOSIS — M25511 Pain in right shoulder: Secondary | ICD-10-CM | POA: Diagnosis not present

## 2017-08-06 DIAGNOSIS — M6281 Muscle weakness (generalized): Secondary | ICD-10-CM | POA: Diagnosis not present

## 2017-08-08 DIAGNOSIS — E669 Obesity, unspecified: Secondary | ICD-10-CM | POA: Diagnosis not present

## 2017-08-08 DIAGNOSIS — M7551 Bursitis of right shoulder: Secondary | ICD-10-CM | POA: Diagnosis not present

## 2017-08-08 DIAGNOSIS — M7541 Impingement syndrome of right shoulder: Secondary | ICD-10-CM | POA: Diagnosis not present

## 2017-08-08 DIAGNOSIS — Z6832 Body mass index (BMI) 32.0-32.9, adult: Secondary | ICD-10-CM | POA: Diagnosis not present

## 2017-08-13 DIAGNOSIS — R102 Pelvic and perineal pain: Secondary | ICD-10-CM | POA: Diagnosis not present

## 2017-08-13 DIAGNOSIS — N921 Excessive and frequent menstruation with irregular cycle: Secondary | ICD-10-CM | POA: Diagnosis not present

## 2017-08-14 DIAGNOSIS — M25611 Stiffness of right shoulder, not elsewhere classified: Secondary | ICD-10-CM | POA: Diagnosis not present

## 2017-08-14 DIAGNOSIS — M25511 Pain in right shoulder: Secondary | ICD-10-CM | POA: Diagnosis not present

## 2017-08-14 DIAGNOSIS — M7541 Impingement syndrome of right shoulder: Secondary | ICD-10-CM | POA: Diagnosis not present

## 2017-08-14 DIAGNOSIS — M6281 Muscle weakness (generalized): Secondary | ICD-10-CM | POA: Diagnosis not present

## 2017-09-09 DIAGNOSIS — N921 Excessive and frequent menstruation with irregular cycle: Secondary | ICD-10-CM | POA: Diagnosis not present

## 2017-09-09 DIAGNOSIS — Z3200 Encounter for pregnancy test, result unknown: Secondary | ICD-10-CM | POA: Diagnosis not present

## 2017-09-30 DIAGNOSIS — M62830 Muscle spasm of back: Secondary | ICD-10-CM | POA: Diagnosis not present

## 2017-10-22 ENCOUNTER — Other Ambulatory Visit: Payer: Self-pay

## 2017-10-22 DIAGNOSIS — F419 Anxiety disorder, unspecified: Secondary | ICD-10-CM

## 2017-10-22 NOTE — Telephone Encounter (Signed)
error 

## 2017-11-25 DIAGNOSIS — R7302 Impaired glucose tolerance (oral): Secondary | ICD-10-CM | POA: Diagnosis not present

## 2017-11-25 DIAGNOSIS — E782 Mixed hyperlipidemia: Secondary | ICD-10-CM | POA: Diagnosis not present

## 2017-11-25 DIAGNOSIS — E669 Obesity, unspecified: Secondary | ICD-10-CM | POA: Diagnosis not present

## 2017-11-25 DIAGNOSIS — I10 Essential (primary) hypertension: Secondary | ICD-10-CM | POA: Diagnosis not present

## 2017-11-25 DIAGNOSIS — Z79899 Other long term (current) drug therapy: Secondary | ICD-10-CM | POA: Diagnosis not present

## 2017-12-27 DIAGNOSIS — Z23 Encounter for immunization: Secondary | ICD-10-CM | POA: Diagnosis not present

## 2018-05-28 DIAGNOSIS — I10 Essential (primary) hypertension: Secondary | ICD-10-CM | POA: Diagnosis not present

## 2018-05-28 DIAGNOSIS — E782 Mixed hyperlipidemia: Secondary | ICD-10-CM | POA: Diagnosis not present

## 2018-05-28 DIAGNOSIS — R7302 Impaired glucose tolerance (oral): Secondary | ICD-10-CM | POA: Diagnosis not present

## 2018-05-28 DIAGNOSIS — Z79899 Other long term (current) drug therapy: Secondary | ICD-10-CM | POA: Diagnosis not present

## 2018-06-18 DIAGNOSIS — M79671 Pain in right foot: Secondary | ICD-10-CM | POA: Diagnosis not present

## 2018-06-18 DIAGNOSIS — B351 Tinea unguium: Secondary | ICD-10-CM | POA: Diagnosis not present

## 2018-06-18 DIAGNOSIS — M7662 Achilles tendinitis, left leg: Secondary | ICD-10-CM | POA: Diagnosis not present

## 2018-07-09 DIAGNOSIS — L03031 Cellulitis of right toe: Secondary | ICD-10-CM | POA: Diagnosis not present

## 2018-07-09 DIAGNOSIS — M7662 Achilles tendinitis, left leg: Secondary | ICD-10-CM | POA: Diagnosis not present

## 2018-07-23 DIAGNOSIS — L03031 Cellulitis of right toe: Secondary | ICD-10-CM | POA: Diagnosis not present

## 2018-10-27 DIAGNOSIS — M7661 Achilles tendinitis, right leg: Secondary | ICD-10-CM | POA: Diagnosis not present

## 2018-11-24 DIAGNOSIS — M722 Plantar fascial fibromatosis: Secondary | ICD-10-CM | POA: Diagnosis not present

## 2018-11-24 DIAGNOSIS — M7661 Achilles tendinitis, right leg: Secondary | ICD-10-CM | POA: Diagnosis not present

## 2018-11-26 DIAGNOSIS — R7302 Impaired glucose tolerance (oral): Secondary | ICD-10-CM | POA: Diagnosis not present

## 2018-11-26 DIAGNOSIS — E669 Obesity, unspecified: Secondary | ICD-10-CM | POA: Diagnosis not present

## 2018-11-26 DIAGNOSIS — E782 Mixed hyperlipidemia: Secondary | ICD-10-CM | POA: Diagnosis not present

## 2018-11-26 DIAGNOSIS — Z124 Encounter for screening for malignant neoplasm of cervix: Secondary | ICD-10-CM | POA: Diagnosis not present

## 2018-11-26 DIAGNOSIS — Z0000 Encounter for general adult medical examination without abnormal findings: Secondary | ICD-10-CM | POA: Diagnosis not present

## 2018-11-26 DIAGNOSIS — Z1159 Encounter for screening for other viral diseases: Secondary | ICD-10-CM | POA: Diagnosis not present

## 2018-11-26 DIAGNOSIS — Z79899 Other long term (current) drug therapy: Secondary | ICD-10-CM | POA: Diagnosis not present

## 2018-11-26 DIAGNOSIS — I10 Essential (primary) hypertension: Secondary | ICD-10-CM | POA: Diagnosis not present

## 2018-11-27 ENCOUNTER — Other Ambulatory Visit: Payer: Self-pay | Admitting: Nurse Practitioner

## 2018-11-27 ENCOUNTER — Other Ambulatory Visit: Payer: Self-pay | Admitting: Neonatology

## 2018-11-27 DIAGNOSIS — Z1231 Encounter for screening mammogram for malignant neoplasm of breast: Secondary | ICD-10-CM

## 2018-12-11 DIAGNOSIS — M76822 Posterior tibial tendinitis, left leg: Secondary | ICD-10-CM | POA: Diagnosis not present

## 2018-12-18 ENCOUNTER — Other Ambulatory Visit: Payer: Self-pay

## 2018-12-18 ENCOUNTER — Ambulatory Visit
Admission: RE | Admit: 2018-12-18 | Discharge: 2018-12-18 | Disposition: A | Discharge status: Home / Self Care | Payer: BC Managed Care – PPO | Source: Ambulatory Visit | Attending: Neonatology | Admitting: Neonatology

## 2018-12-18 DIAGNOSIS — Z1231 Encounter for screening mammogram for malignant neoplasm of breast: Secondary | ICD-10-CM

## 2018-12-31 DIAGNOSIS — Z23 Encounter for immunization: Secondary | ICD-10-CM | POA: Diagnosis not present

## 2018-12-31 DIAGNOSIS — H6010 Cellulitis of external ear, unspecified ear: Secondary | ICD-10-CM | POA: Diagnosis not present

## 2019-04-09 ENCOUNTER — Ambulatory Visit: Payer: BC Managed Care – PPO | Attending: Internal Medicine

## 2019-04-09 DIAGNOSIS — Z20822 Contact with and (suspected) exposure to covid-19: Secondary | ICD-10-CM

## 2019-04-12 ENCOUNTER — Telehealth: Payer: Self-pay

## 2019-04-12 NOTE — Telephone Encounter (Signed)
Received call asking for updated test results. No results available at this time.

## 2019-04-14 ENCOUNTER — Ambulatory Visit: Payer: BC Managed Care – PPO | Attending: Internal Medicine

## 2019-04-14 DIAGNOSIS — Z20822 Contact with and (suspected) exposure to covid-19: Secondary | ICD-10-CM

## 2019-04-15 LAB — NOVEL CORONAVIRUS, NAA

## 2019-04-16 LAB — NOVEL CORONAVIRUS, NAA: SARS-CoV-2, NAA: NOT DETECTED

## 2019-06-24 ENCOUNTER — Other Ambulatory Visit: Payer: Self-pay | Admitting: Podiatry

## 2019-06-24 ENCOUNTER — Other Ambulatory Visit (HOSPITAL_COMMUNITY): Payer: Self-pay | Admitting: Podiatry

## 2019-06-24 DIAGNOSIS — M76822 Posterior tibial tendinitis, left leg: Secondary | ICD-10-CM

## 2019-06-24 DIAGNOSIS — D489 Neoplasm of uncertain behavior, unspecified: Secondary | ICD-10-CM

## 2019-06-25 ENCOUNTER — Ambulatory Visit: Payer: BC Managed Care – PPO

## 2019-06-25 ENCOUNTER — Ambulatory Visit: Payer: BC Managed Care – PPO | Attending: Internal Medicine

## 2019-06-25 DIAGNOSIS — Z23 Encounter for immunization: Secondary | ICD-10-CM

## 2019-06-25 NOTE — Progress Notes (Signed)
   Covid-19 Vaccination Clinic  Name:  Morgan Gordon    MRN: [id number] DOB: Apr 01, 1971  06/25/2019  [name] was observed post Covid-19 immunization for 15 minutes without incident. She was provided with Vaccine Information Sheet and instruction to access the V-Safe system.   [name] was instructed to call 911 with any severe reactions post vaccine: Marland Kitchen Difficulty breathing  . Swelling of face and throat  . A fast heartbeat  . A bad rash all over body  . Dizziness and weakness   Immunizations Administered    Name Date Dose VIS Date Route   Pfizer COVID-19 Vaccine 06/25/2019 11:08 AM 0.3 mL 03/20/2019 Intramuscular   Manufacturer: [place]   Lot: [id number]   [place]: [id number]

## 2019-06-29 ENCOUNTER — Ambulatory Visit: Payer: BC Managed Care – PPO

## 2019-07-05 ENCOUNTER — Ambulatory Visit
Admission: RE | Admit: 2019-07-05 | Discharge: 2019-07-05 | Disposition: A | Discharge status: Home / Self Care | Payer: BC Managed Care – PPO | Source: Ambulatory Visit | Attending: Podiatry | Admitting: Podiatry

## 2019-07-05 ENCOUNTER — Other Ambulatory Visit: Payer: Self-pay

## 2019-07-05 DIAGNOSIS — D489 Neoplasm of uncertain behavior, unspecified: Secondary | ICD-10-CM | POA: Insufficient documentation

## 2019-07-05 DIAGNOSIS — M76822 Posterior tibial tendinitis, left leg: Secondary | ICD-10-CM | POA: Insufficient documentation

## 2019-07-05 MED ORDER — GADOBUTROL 1 MMOL/ML IV SOLN
9.0000 mL | Freq: Once | INTRAVENOUS | Status: AC | PRN
  Administered 2019-07-05: 9 mL via INTRAVENOUS

## 2019-07-21 ENCOUNTER — Ambulatory Visit: Payer: BC Managed Care – PPO | Attending: Internal Medicine

## 2019-07-21 DIAGNOSIS — Z23 Encounter for immunization: Secondary | ICD-10-CM

## 2019-07-21 NOTE — Progress Notes (Signed)
   Covid-19 Vaccination Clinic  Name:  Morgan Gordon    MRN: [id number] DOB: [birth year]  07/21/2019  Ms. Morgan Gordon was observed post Covid-19 immunization for 15 minutes without incident. She was provided with Vaccine Information Sheet and instruction to access the V-Safe system.   Ms. Morgan Gordon was instructed to call 911 with any severe reactions post vaccine: Marland Kitchen Difficulty breathing  . Swelling of face and throat  . A fast heartbeat  . A bad rash all over body  . Dizziness and weakness   Immunizations Administered    Name Date Dose VIS Date Route   Pfizer COVID-19 Vaccine 07/21/2019 11:05 AM 0.3 mL 03/20/2019 Intramuscular   Manufacturer: [place]   Lot: [id number]   [place]: [id number]

## 2020-04-13 IMAGING — MG MM DIGITAL SCREENING BILAT W/ TOMO W/ CAD
8 series · 8 of 24 positions shown · non-contrast
Comparison: None.

CLINICAL DATA: Screening.

EXAM:
DIGITAL SCREENING BILATERAL MAMMOGRAM WITH TOMO AND CAD

[R CC synth-2D]
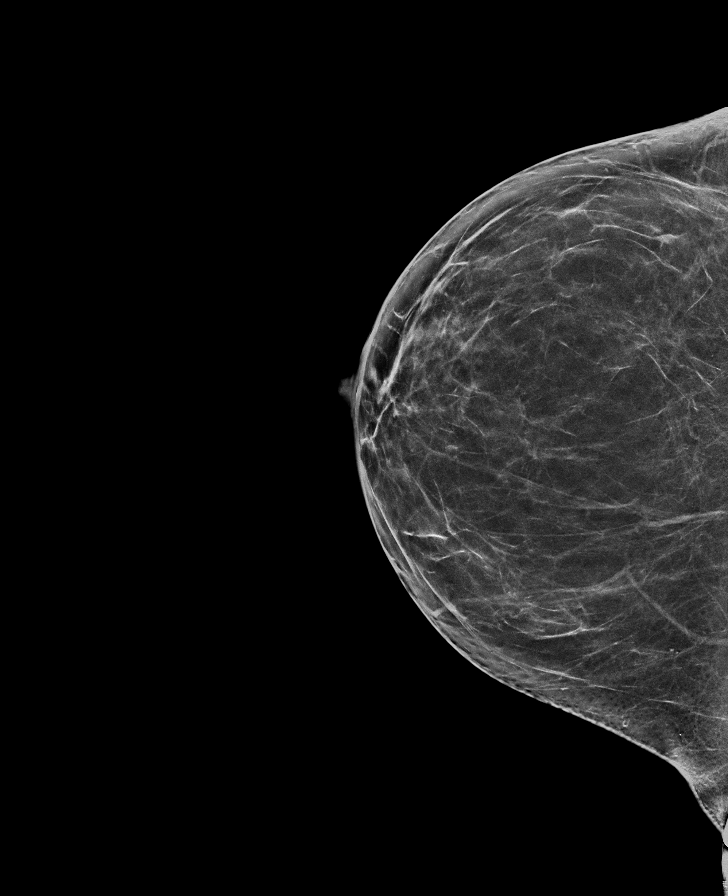

[R MLO synth-2D]
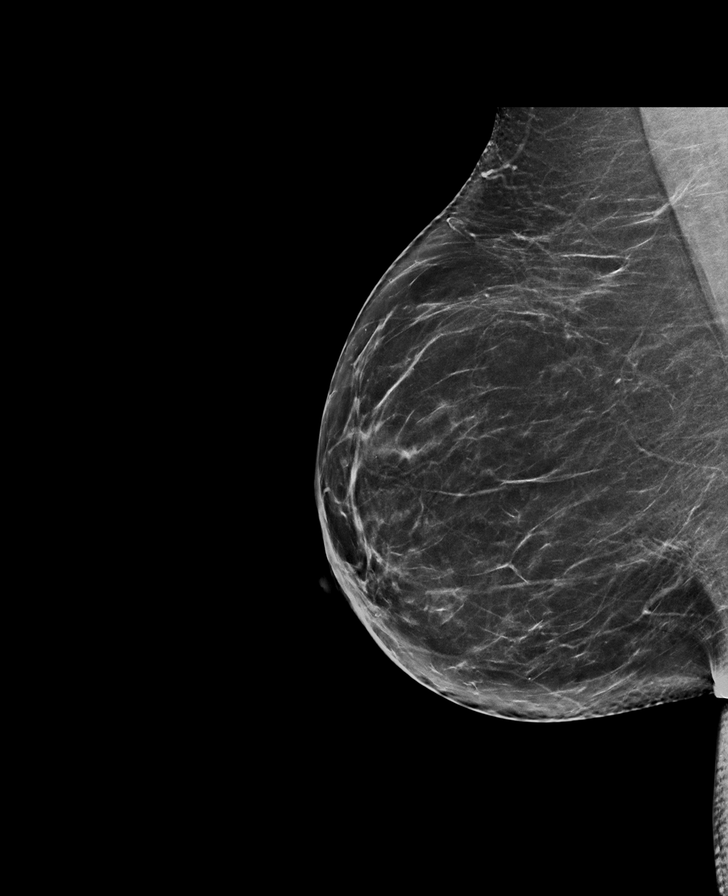

[L MLO synth-2D]
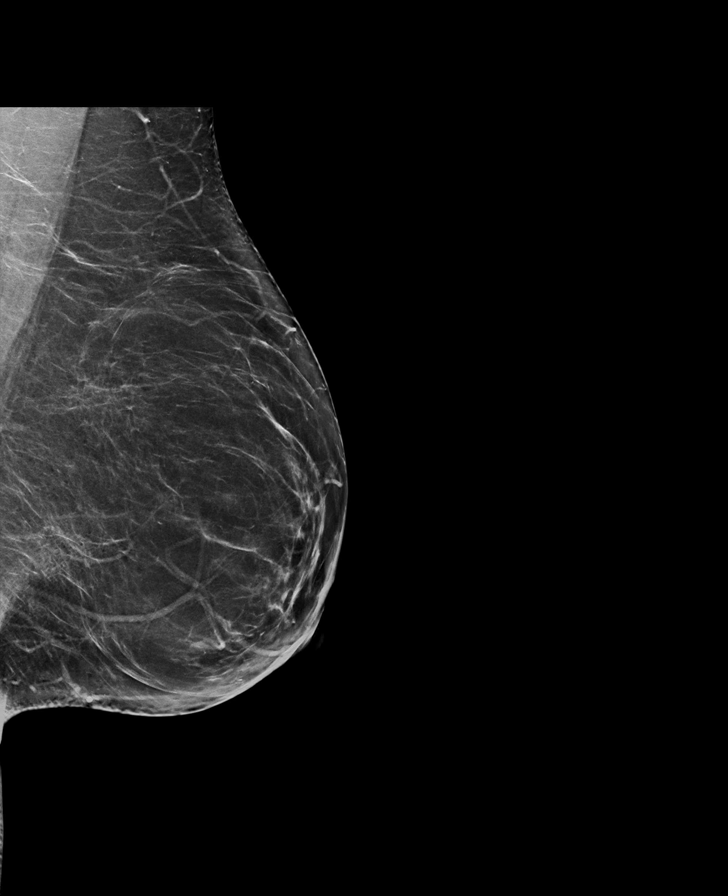

[L CC synth-2D]
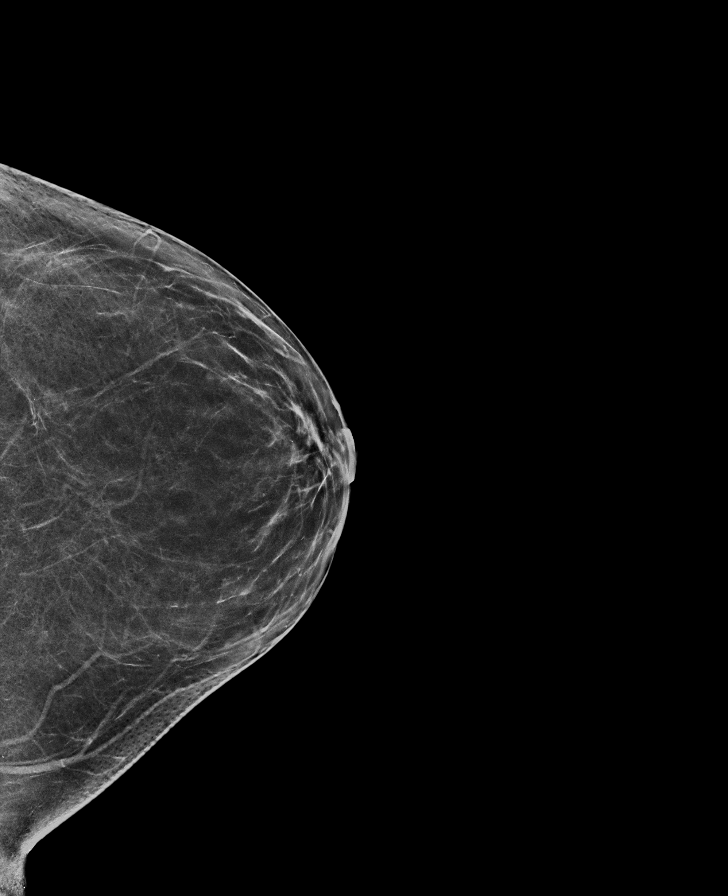

[R MLO tomo · tomo slice 41/80.0]
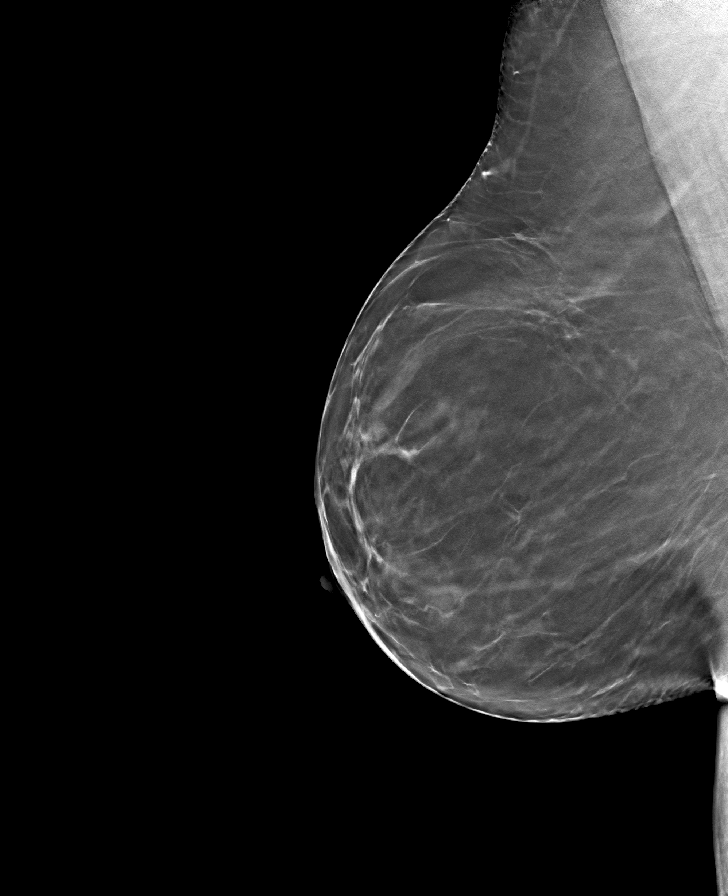

[R CC tomo · tomo slice 33/66.0]
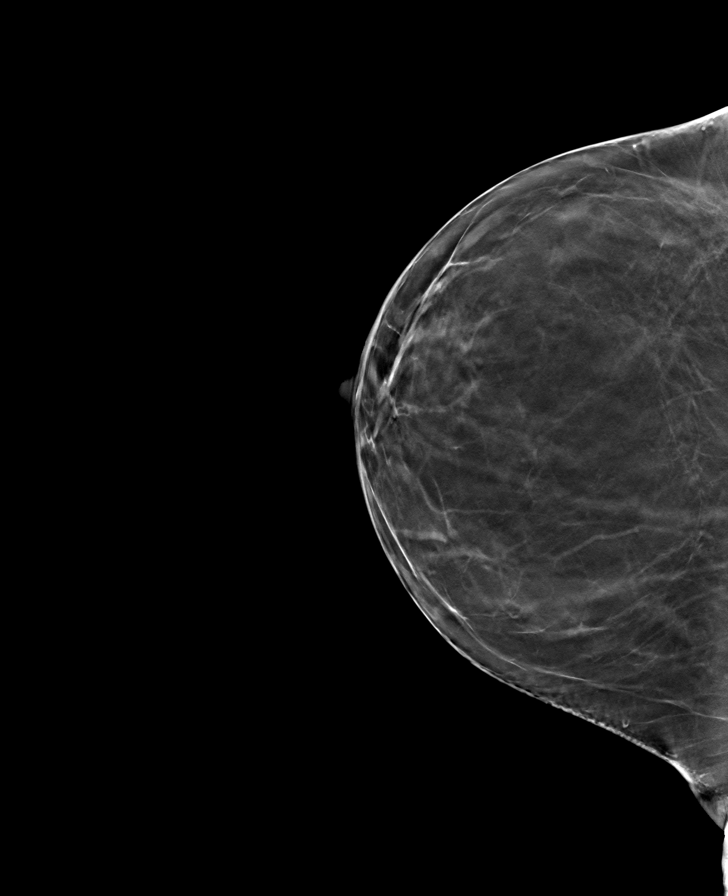

[L CC tomo · tomo slice 35/68.0]
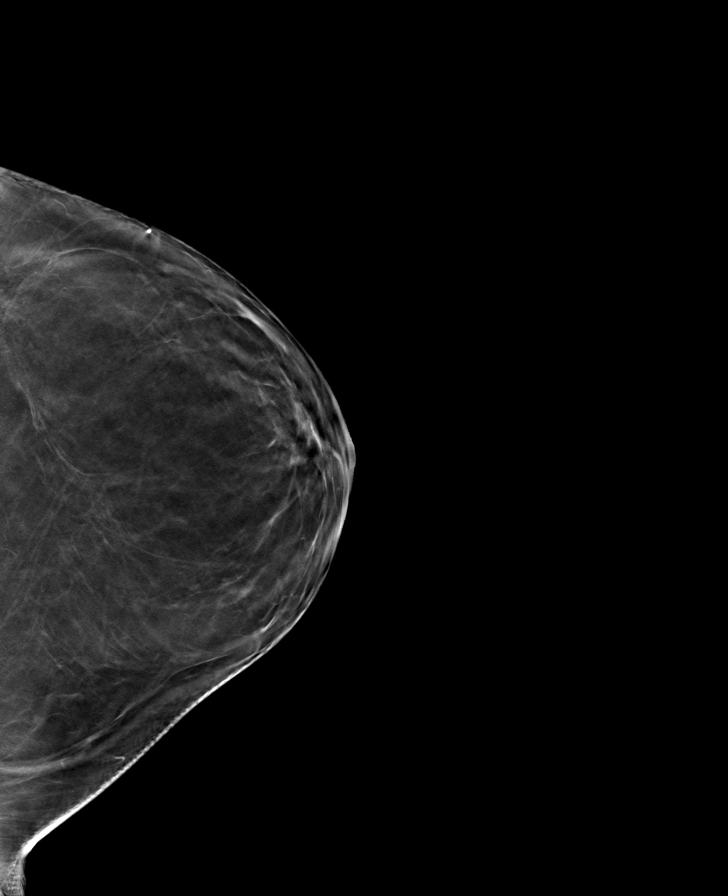

[L MLO tomo · tomo slice 39/76.0]
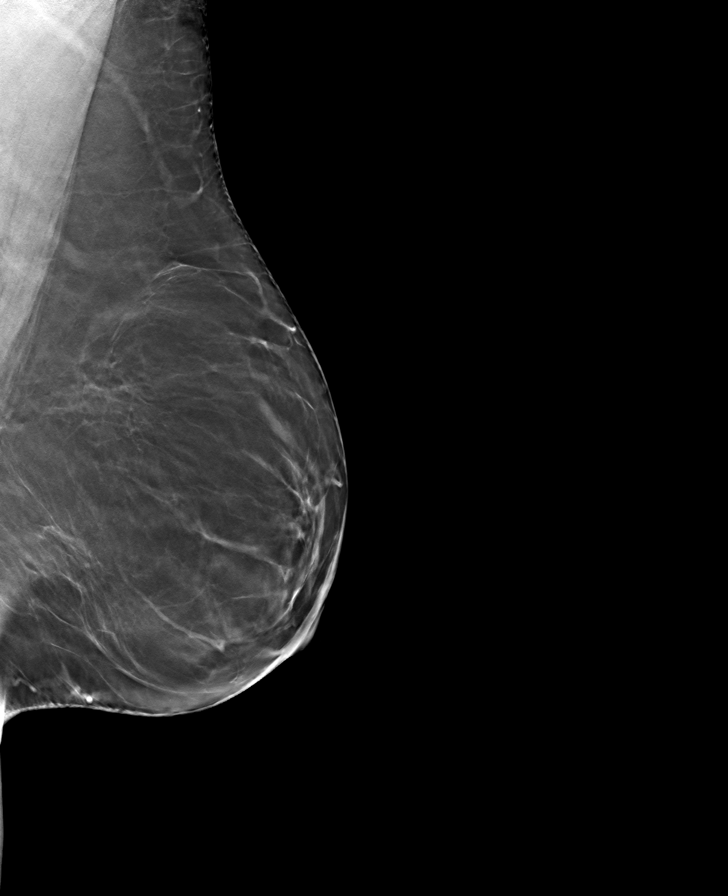

[8 of 24 positions shown; findings below may reference images not displayed]

ACR Breast Density Category b: There are scattered areas of
fibroglandular density.
FINDINGS: There are no findings suspicious for malignancy. Images were
processed with CAD.
IMPRESSION: No mammographic evidence of malignancy. A result letter of this
screening mammogram will be mailed directly to the patient.

RECOMMENDATION:
Screening mammogram in one year. (Code:Y5-G-EJ6)

BI-RADS CATEGORY  1: Negative.

## 2020-09-23 ENCOUNTER — Other Ambulatory Visit: Payer: Self-pay

## 2020-09-23 ENCOUNTER — Ambulatory Visit
Admission: RE | Admit: 2020-09-23 | Discharge: 2020-09-23 | Disposition: A | Discharge status: Home / Self Care | Payer: PRIVATE HEALTH INSURANCE | Source: Ambulatory Visit | Attending: Family Medicine | Admitting: Family Medicine

## 2020-09-23 VITALS — BP 147/85 | HR 77 | Temp 99.0°F | Resp 17 | Ht 64.75 in | Wt 200.0 lb

## 2020-09-23 DIAGNOSIS — J0100 Acute maxillary sinusitis, unspecified: Secondary | ICD-10-CM | POA: Diagnosis not present

## 2020-09-23 MED ORDER — PROMETHAZINE-DM 6.25-15 MG/5ML PO SYRP: 5.0000 mL | ORAL_SOLUTION | Freq: Four times a day (QID) | ORAL | 0 refills | Status: DC | PRN

## 2020-09-23 MED ORDER — DOXYCYCLINE HYCLATE 100 MG PO CAPS: 100.0000 mg | ORAL_CAPSULE | Freq: Two times a day (BID) | ORAL | 0 refills | Status: DC

## 2020-09-23 NOTE — ED Provider Notes (Signed)
[facility]    CSN: [id number] Arrival date & time: 09/23/20  0856      History   Chief Complaint Chief Complaint  Patient presents with   Appointment   Nasal Congestion    HPI  50 year old female presents with respiratory symptoms.  Patient states that she has been sick for the past week.  Patient states that she came to contact with her grandson who was sick.  She subsequently got sick as well.  She reports nasal congestion, postnasal drip, sore throat, cough.  She states that she has thick nasal mucus.  She has tried multiple over-the-counter medications without relief.  Denies shortness of breath.  Denies fever.  No other reported sick contacts.  No other complaints or concerns at this time.   Past Medical History:  Diagnosis Date   Allergy    Year-round allergies and sinus infections.   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 Sees Chiropractor in [place].    GERD (gastroesophageal reflux disease)    History of genital warts    Had D and C   Hyperlipidemia    Hypertension     Patient Active Problem List   Diagnosis Date Noted   Esophageal dysphagia    Heartburn    Esophagitis, unspecified    Stomach irritation    Abnormal uterine bleeding (AUB) 03/04/2017   GERD (gastroesophageal reflux disease) 02/20/2017   Prolonged menstrual cycle 11/08/2016   Prediabetes 08/09/2016   Well woman exam without gynecological exam 04/03/2016   Chronic left shoulder pain 02/17/2016   Chronic right shoulder pain 08/30/2015   Family history of diabetes mellitus in mother 06/02/2015   Obesity 05/10/2015   Hypertension 04/26/2015   Arthritis 04/26/2015   Anxiety 04/26/2015   Hyperlipidemia 04/26/2015   Environmental and seasonal allergies 04/26/2015    Past Surgical History:  Procedure Laterality Date    ESOPHAGOGASTRODUODENOSCOPY (EGD) WITH PROPOFOL N/A 03/08/2017   Procedure: ESOPHAGOGASTRODUODENOSCOPY (EGD) WITH PROPOFOL;  Surgeon: Virgel Manifold, MD;  Location: [facility];  Service: Endoscopy;  Laterality: N/A;   LESION EXCISION     genital warts   SEPTOPLASTY N/A 03/14/2017   Procedure: SEPTOPLASTY;  Surgeon: Margaretha Sheffield, MD;  Location: [place];  Service: ENT;  Laterality: N/A;   [place] Bilateral 03/14/2017   Procedure: BILATERAL INFERIOR TURBINATE OUT-FRACTURE;  Surgeon: Margaretha Sheffield, MD;  Location: [facility];  Service: ENT;  Laterality: Bilateral;    OB History   No obstetric history on file.      Home Medications    Prior to Admission medications   Medication Sig Start Date End Date Taking? Authorizing Provider  Ascorbic Acid (VITAMIN C) 1000 MG tablet Take 1 tablet daily by mouth.   Yes [provider]  busPIRone (BUSPAR) 30 MG tablet Take 1 tablet (30 mg total) 2 (two) times daily by mouth. 02/20/17  Yes Roselee Nova, MD  doxycycline (VIBRAMYCIN) 100 MG capsule Take 1 capsule (100 mg total) by mouth 2 (two) times daily. 09/23/20  Yes Dalon Reichart G, DO  famotidine (PEPCID) 20 MG tablet Take 20 mg by mouth daily.   Yes [provider]  lovastatin (MEVACOR) 40 MG tablet TAKE 1 TABLET EACH EVENING 11/08/16  Yes Keith Rake  Asad A, MD  montelukast (SINGULAIR) 10 MG tablet TAKE 1 TABLET BY MOUTH EVERY DAY 03/17/17  Yes Roselee Nova, MD  promethazine-dextromethorphan (PROMETHAZINE-DM) 6.25-15 MG/5ML syrup Take 5 mLs by mouth 4 (four) times daily as needed for cough. 09/23/20  Yes Mykalah Saari G, DO  ramipril (ALTACE) 10 MG capsule TAKE 1 CAPSULE (10 MG TOTAL) BY MOUTH EVERY MORNING. 04/22/17  Yes Roselee Nova, MD    Family History Family History  Problem Relation Age of Onset   Thyroid disease Mother    Cancer Mother        breast   Diabetes Mother    Breast cancer Mother    Cancer  Father        lymph node cancer   Diabetes Father    Diabetes Brother    Thyroid disease Brother    Colon cancer Maternal Grandmother     Social History Social History   Tobacco Use   Smoking status: Former    Packs/day: 0.50    Pack years: 0.00    Types: Cigarettes    Quit date: 06/24/2013    Years since quitting: 7.2   Smokeless tobacco: Never  Vaping Use   Vaping Use: Never used  Substance Use Topics   Alcohol use: No   Drug use: No     Allergies   Codeine, Krill oil, Penicillins, and Gluten meal   Review of Systems Review of Systems Per HPI  Physical Exam Triage Vital Signs ED Triage Vitals  Enc Vitals Group     BP 09/23/20 0915 (!) 147/85     Pulse Rate 09/23/20 0915 77     Resp 09/23/20 0915 17     Temp 09/23/20 0915 99 F (37.2 C)     Temp Source 09/23/20 0915 Oral     SpO2 09/23/20 0915 97 %     Weight 09/23/20 0915 200 lb (90.7 kg)     Height 09/23/20 0915 5' 4.75" (1.645 m)     Head Circumference --      Peak Flow --      Pain Score 09/23/20 0914 8     Pain Loc --      Pain Edu? --      Excl. in [place]? --    Updated Vital Signs BP (!) 147/85 (BP Location: Left Arm)   Pulse 77   Temp 99 F (37.2 C) (Oral)   Resp 17   Ht 5' 4.75" (1.645 m)   Wt 90.7 kg   LMP 12/28/2016 (Approximate)   SpO2 97%   BMI 33.54 kg/m   Visual Acuity Right Eye Distance:   Left Eye Distance:   Bilateral Distance:    Right Eye Near:   Left Eye Near:    Bilateral Near:     Physical Exam Constitutional:      General: She is not in acute distress.    Appearance: Normal appearance. She is not ill-appearing.  HENT:     Head: Normocephalic and atraumatic.     Right Ear: Tympanic membrane normal.     Left Ear: Tympanic membrane normal.     Nose:     Comments: Maxillary sinus tenderness to palpation.    Mouth/Throat:     Pharynx: Oropharynx is clear. No oropharyngeal exudate or posterior oropharyngeal erythema.  Eyes:     General:        Right eye: No  discharge.        Left eye: No discharge.  Conjunctiva/sclera: Conjunctivae normal.  Cardiovascular:     Rate and Rhythm: Normal rate and regular rhythm.  Pulmonary:     Effort: Pulmonary effort is normal.     Breath sounds: Normal breath sounds. No wheezing or rales.  Neurological:     Mental Status: She is alert.  Psychiatric:        Mood and Affect: Mood normal.        Behavior: Behavior normal.     UC Treatments / Results  Labs (all labs ordered are listed, but only abnormal results are displayed) Labs Reviewed - No data to display  EKG   Radiology No results found.  Procedures Procedures (including critical care time)  Medications Ordered in UC Medications - No data to display  Initial Impression / Assessment and Plan / UC Course  I have reviewed the triage vital signs and the nursing notes.  Pertinent labs & imaging results that were available during my care of the patient were reviewed by me and considered in my medical decision making (see chart for details).    50 year old female presents with sinusitis.  Treating with doxycycline.  Promethazine DM for cough.  Final Clinical Impressions(s) / UC Diagnoses   Final diagnoses:  Acute maxillary sinusitis, recurrence not specified     Discharge Instructions      Medication as prescribed.  If you worsen, please let us know.  Take care  Dr. Lacinda Axon    ED Prescriptions     Medication Sig Dispense Auth. Provider   doxycycline (VIBRAMYCIN) 100 MG capsule Take 1 capsule (100 mg total) by mouth 2 (two) times daily. 14 capsule Harbert Fitterer G, DO   promethazine-dextromethorphan (PROMETHAZINE-DM) 6.25-15 MG/5ML syrup Take 5 mLs by mouth 4 (four) times daily as needed for cough. 118 mL Coral Spikes, DO      PDMP not reviewed this encounter.   Thersa Salt [place], [place] 09/23/20 [phone]

## 2020-09-23 NOTE — ED Triage Notes (Signed)
Patient states that she has been having nasal congestion, cough with thick productivity. States that grandson was sick and she kept him last week. States that her symptoms started on Saturday.

## 2020-09-23 NOTE — Discharge Instructions (Addendum)
Medication as prescribed.  If you worsen, please let us know.  Take care  Dr. Lacinda Axon

## 2020-12-15 ENCOUNTER — Other Ambulatory Visit: Payer: Self-pay | Admitting: Nurse Practitioner

## 2020-12-15 DIAGNOSIS — Z1231 Encounter for screening mammogram for malignant neoplasm of breast: Secondary | ICD-10-CM

## 2021-01-02 ENCOUNTER — Ambulatory Visit
Admission: RE | Admit: 2021-01-02 | Discharge: 2021-01-02 | Disposition: A | Discharge status: Home / Self Care | Payer: PRIVATE HEALTH INSURANCE | Source: Ambulatory Visit | Attending: Nurse Practitioner | Admitting: Nurse Practitioner

## 2021-01-02 ENCOUNTER — Other Ambulatory Visit: Payer: Self-pay

## 2021-01-02 DIAGNOSIS — Z1231 Encounter for screening mammogram for malignant neoplasm of breast: Secondary | ICD-10-CM

## 2022-04-07 ENCOUNTER — Ambulatory Visit (INDEPENDENT_AMBULATORY_CARE_PROVIDER_SITE_OTHER): Payer: Self-pay

## 2022-04-07 ENCOUNTER — Ambulatory Visit
Admission: RE | Admit: 2022-04-07 | Discharge: 2022-04-07 | Disposition: A | Discharge status: Home / Self Care | Payer: PRIVATE HEALTH INSURANCE | Source: Ambulatory Visit | Attending: Family Medicine | Admitting: Family Medicine

## 2022-04-07 VITALS — BP 170/97 | HR 82 | Temp 98.2°F | Resp 14 | Ht 64.5 in | Wt 200.0 lb

## 2022-04-07 DIAGNOSIS — I10 Essential (primary) hypertension: Secondary | ICD-10-CM

## 2022-04-07 DIAGNOSIS — Z91148 Patient's other noncompliance with medication regimen for other reason: Secondary | ICD-10-CM

## 2022-04-07 DIAGNOSIS — M25531 Pain in right wrist: Secondary | ICD-10-CM

## 2022-04-07 DIAGNOSIS — S63501A Unspecified sprain of right wrist, initial encounter: Secondary | ICD-10-CM

## 2022-04-07 DIAGNOSIS — M79641 Pain in right hand: Secondary | ICD-10-CM

## 2022-04-07 MED ORDER — KETOROLAC TROMETHAMINE 60 MG/2ML IM SOLN
30.0000 mg | Freq: Once | INTRAMUSCULAR | Status: AC
  Administered 2022-04-07: 30 mg via INTRAMUSCULAR

## 2022-04-07 NOTE — ED Triage Notes (Signed)
Patient states she was opening a coffee canister and felt a sharp pin in her right hand and the pain radiates up her right wrist and forearm on Wed morning. patient reports increase pain with movement. Patient has been taking Tylenol

## 2022-04-07 NOTE — ED Provider Notes (Incomplete)
[facility]    CSN: [id number] Arrival date & time: 04/07/22  0946      History   Chief Complaint Chief Complaint  Patient presents with   Wrist Pain    Appointment   Hand Pain    HPI  HPI Morgan Gordon is a 51 y.o. female.   [name] presents for left hand and wrist pain. She has trouble moving her hand.  Feels liek she strained it. Has pain in fingers in her hand and wrist.   She was opening a pampered chef canister and had trouble taking it off.  She has carpal tunnel.  Her hand is swollen.  Had been applying heat and ice.  Wearing an ace bandage helps. Has throbbing pain. The muscles in her forearm are tight.   ***Injury occurred, mechanism and which ***shoulder injured.  Reports *** immediate pain in his shoulder ***. Did ***not hear any pop or abnormal sounds with his injury.  Continues to have *** description pain when moving his arm forward. Denies ***redness, swelling. Does not feel like her ***arm is weak. Has tried *** with little relief.  ***No change in pain day vs night.  Has ***never injured this *** shoulder before.  ***she is ***right handed.    Fever : no  Sore throat: no   Cough: no Appetite: normal  Hydration: normal  Abdominal pain: no Nausea: no Vomiting: no Sleep disturbance: no *** Back Pain: no Headache: no     Past Medical History:  Diagnosis Date   Allergy    Year-round allergies and sinus infections.   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 Sees Chiropractor in [place].    GERD (gastroesophageal reflux disease)    History of genital warts    Had D and C   Hyperlipidemia    Hypertension     Patient Active Problem List   Diagnosis Date Noted   Esophageal dysphagia    Heartburn    Esophagitis, unspecified    Stomach irritation    Abnormal uterine bleeding (AUB)  03/04/2017   GERD (gastroesophageal reflux disease) 02/20/2017   Prolonged menstrual cycle 11/08/2016   Prediabetes 08/09/2016   Well woman exam without gynecological exam 04/03/2016   Chronic left shoulder pain 02/17/2016   Chronic right shoulder pain 08/30/2015   Family history of diabetes mellitus in mother 06/02/2015   Obesity 05/10/2015   Hypertension 04/26/2015   Arthritis 04/26/2015   Anxiety 04/26/2015   Hyperlipidemia 04/26/2015   Environmental and seasonal allergies 04/26/2015    Past Surgical History:  Procedure Laterality Date   ESOPHAGOGASTRODUODENOSCOPY (EGD) WITH PROPOFOL N/A 03/08/2017   Procedure: ESOPHAGOGASTRODUODENOSCOPY (EGD) WITH PROPOFOL;  Surgeon: Virgel Manifold, MD;  Location: [facility];  Service: Endoscopy;  Laterality: N/A;   LESION EXCISION     genital warts   SEPTOPLASTY N/A 03/14/2017   Procedure: SEPTOPLASTY;  Surgeon: Margaretha Sheffield, MD;  Location: [facility];  Service: ENT;  Laterality: N/A;   [place] Bilateral 03/14/2017   Procedure: BILATERAL INFERIOR TURBINATE OUT-FRACTURE;  Surgeon: Margaretha Sheffield, MD;  Location: [facility];  Service: ENT;  Laterality: Bilateral;    OB History   No obstetric history on file.      Home Medications  Prior to Admission medications   Medication Sig Start Date End Date Taking? Authorizing Provider  Ascorbic Acid (VITAMIN C) 1000 MG tablet Take 1 tablet daily by mouth.    [provider]  busPIRone (BUSPAR) 30 MG tablet Take 1 tablet (30 mg total) 2 (two) times daily by mouth. 02/20/17   Roselee Nova, MD  doxycycline (VIBRAMYCIN) 100 MG capsule Take 1 capsule (100 mg total) by mouth 2 (two) times daily. 09/23/20   Coral Spikes, DO  famotidine (PEPCID) 20 MG tablet Take 20 mg by mouth daily.    [provider]  lovastatin (MEVACOR) 40 MG tablet TAKE 1 TABLET EACH EVENING 11/08/16   Roselee Nova, MD  montelukast (SINGULAIR)  10 MG tablet TAKE 1 TABLET BY MOUTH EVERY DAY 03/17/17   Roselee Nova, MD  promethazine-dextromethorphan (PROMETHAZINE-DM) 6.25-15 MG/5ML syrup Take 5 mLs by mouth 4 (four) times daily as needed for cough. 09/23/20   Coral Spikes, DO  ramipril (ALTACE) 10 MG capsule TAKE 1 CAPSULE (10 MG TOTAL) BY MOUTH EVERY MORNING. 04/22/17   Roselee Nova, MD    Family History Family History  Problem Relation Age of Onset   Thyroid disease Mother    Diabetes Mother    Cancer Father        lymph node cancer   Diabetes Father    Colon cancer Maternal Grandmother    Diabetes Brother    Thyroid disease Brother     Social History Social History   Tobacco Use   Smoking status: Former    Packs/day: 0.50    Types: Cigarettes    Quit date: 06/24/2013    Years since quitting: 8.7   Smokeless tobacco: Never  Vaping Use   Vaping Use: Never used  Substance Use Topics   Alcohol use: No   Drug use: No     Allergies   Codeine, Krill oil, Penicillins, and Gluten meal   Review of Systems Review of Systems: :negative unless otherwise stated in HPI.      Physical Exam Triage Vital Signs ED Triage Vitals  Enc Vitals Group     BP 04/07/22 1008 (!) 170/97     Pulse Rate 04/07/22 1008 82     Resp 04/07/22 1008 14     Temp 04/07/22 1008 98.2 F (36.8 C)     Temp Source 04/07/22 1008 Oral     SpO2 04/07/22 1008 99 %     Weight 04/07/22 1004 199 lb 15.3 oz (90.7 kg)     Height 04/07/22 1004 5' 4.5" (1.638 m)     Head Circumference --      Peak Flow --      Pain Score 04/07/22 1004 4     Pain Loc --      Pain Edu? --      Excl. in [place]? --    No data found.  Updated Vital Signs BP (!) 170/97 (BP Location: Left Arm)   Pulse 82   Temp 98.2 F (36.8 C) (Oral)   Resp 14   Ht 5' 4.5" (1.638 m)   Wt 90.7 kg   LMP 12/23/2016 (Approximate) Comment: period every 3 months due to hormone therapy - preg test neg  SpO2 99%   BMI 33.79 kg/m   Visual Acuity Right Eye Distance:   Left  Eye Distance:   Bilateral Distance:    Right Eye Near:   Left Eye Near:    Bilateral Near:  Physical Exam GEN: well appearing female in no acute distress  CVS: well perfused  RESP: speaking in full sentences without pause, no respiratory distress  MSK:   *** shoulder:  No evidence of bony deformity, asymmetry, or muscle atrophy. No tenderness over long head of biceps (bicipital groove).  No TTP at [facility] joint.  Full active and passive (ABD, ADD, Flexion, extension, IR, ER). Limited *** 2/2 to pain.  Strength 5/5 grip, elbow and shoulder. No abnormal scapular function observed.  Special Tests: Luan Pulling: ***; Empty Can: ***, Neer's: Negative; Painful arc: Negative; Anterior Apprehension: Negative Sensation intact. Peripheral pulses intact.   UC Treatments / Results  Labs (all labs ordered are listed, but only abnormal results are displayed) Labs Reviewed - No data to display  EKG   Radiology DG Hand Complete Right  Result Date: 04/07/2022 CLINICAL DATA:  Right hand injury while opening can several days ago. Right hand pain. EXAM: RIGHT HAND - COMPLETE 3+ VIEW COMPARISON:  None Available. FINDINGS: There is no evidence of fracture or dislocation. There is no evidence of arthropathy or other focal bone abnormality. Soft tissues are unremarkable. IMPRESSION: Negative. Electronically Signed   By: Marlaine Hind M.D.   On: 04/07/2022 10:27   DG Wrist Complete Right  Result Date: 04/07/2022 CLINICAL DATA:  Right wrist injury while opening can several days ago. Right wrist pain. EXAM: RIGHT WRIST - COMPLETE 3+ VIEW COMPARISON:  None Available. FINDINGS: There is no evidence of fracture or dislocation. There is no evidence of arthropathy or other focal bone abnormality. Soft tissues are unremarkable. IMPRESSION: Negative. Electronically Signed   By: Marlaine Hind M.D.   On: 04/07/2022 10:26    Procedures Procedures (including critical care time)  Medications Ordered in UC Medications  - No data to display  Initial Impression / Assessment and Plan / UC Course  I have reviewed the triage vital signs and the nursing notes.  Pertinent labs & imaging results that were available during my care of the patient were reviewed by me and considered in my medical decision making (see chart for details).      Pt is a 51 y.o.  female with *** days of *** shoulder pain after ***.   Obtained *** shoulder plain films.  Personally reviewed by me were ***unremarkable for fracture or dislocation. Radiologist notes *** no soft tissue swelling. On exam, pt has tenderness at *** concerning for ***.  Given ***Toradol IM/sling/brace/crutches  Patient to gradually return to normal activities, as tolerated and continue ordinary activities within the limits permitted by pain. Prescribed Naproxen sodium *** and muscle relaxer *** for pain relief.  Tylenol PRN. Advised patient to avoid OTC NSAIDs while taking prescription NSAID. Counseled patient on red flag symptoms and when to seek immediate care.  ***No red flags such as progressive major motor weakness.   Patient to follow up with orthopedic provider, if symptoms do not improve with conservative treatment.  Return and ED precautions given. Understanding voiced. Discussed MDM, treatment plan and plan for follow-up with patient/parent who agrees with plan.   Final Clinical Impressions(s) / UC Diagnoses   Final diagnoses:  None   Discharge Instructions   None    ED Prescriptions   None    PDMP not reviewed this encounter.

## 2022-04-07 NOTE — Discharge Instructions (Addendum)
Your x-ray did not show a fracture.  You were wrapped with the Ace wrap to keep your thumb in a special position to limit further injury to your hand and wrist.  You are given an injection for pain that is also a anti-inflammatory medication.  Continue taking Tylenol at 1000 mg every 6-8 hours as needed for pain.

## 2022-06-01 ENCOUNTER — Ambulatory Visit: Payer: 59 | Admitting: Internal Medicine

## 2022-06-01 ENCOUNTER — Encounter: Payer: Self-pay | Admitting: Internal Medicine

## 2022-06-01 VITALS — BP 174/102 | HR 81 | Temp 98.2°F | Resp 16 | Ht 64.78 in | Wt 214.2 lb

## 2022-06-01 DIAGNOSIS — I10 Essential (primary) hypertension: Secondary | ICD-10-CM

## 2022-06-01 DIAGNOSIS — K219 Gastro-esophageal reflux disease without esophagitis: Secondary | ICD-10-CM | POA: Diagnosis not present

## 2022-06-01 DIAGNOSIS — Z1159 Encounter for screening for other viral diseases: Secondary | ICD-10-CM

## 2022-06-01 DIAGNOSIS — R7303 Prediabetes: Secondary | ICD-10-CM | POA: Diagnosis not present

## 2022-06-01 DIAGNOSIS — Z13220 Encounter for screening for lipoid disorders: Secondary | ICD-10-CM

## 2022-06-01 DIAGNOSIS — R197 Diarrhea, unspecified: Secondary | ICD-10-CM | POA: Diagnosis not present

## 2022-06-01 DIAGNOSIS — F329 Major depressive disorder, single episode, unspecified: Secondary | ICD-10-CM | POA: Diagnosis not present

## 2022-06-01 DIAGNOSIS — Z1231 Encounter for screening mammogram for malignant neoplasm of breast: Secondary | ICD-10-CM

## 2022-06-01 DIAGNOSIS — Z1211 Encounter for screening for malignant neoplasm of colon: Secondary | ICD-10-CM | POA: Diagnosis not present

## 2022-06-01 MED ORDER — HYDROCHLOROTHIAZIDE 12.5 MG PO CAPS: 12.5000 mg | ORAL_CAPSULE | Freq: Every day | ORAL | 1 refills | Status: DC

## 2022-06-01 MED ORDER — HYDROXYZINE HCL 10 MG PO TABS: 10.0000 mg | ORAL_TABLET | Freq: Three times a day (TID) | ORAL | 0 refills | Status: DC | PRN

## 2022-06-01 MED ORDER — ESCITALOPRAM OXALATE 10 MG PO TABS: 10.0000 mg | ORAL_TABLET | Freq: Every day | ORAL | 1 refills | Status: DC

## 2022-06-01 NOTE — Progress Notes (Signed)
New Patient Office Visit  Subjective    Patient ID: Morgan Gordon, female    DOB: Sep 07, 1970  Age: 52 y.o. MRN: [id number]  CC:  Chief Complaint  Patient presents with   Establish Care    labs    HPI Morgan Gordon presents to establish care. Morgan Gordon has not been seen by a medical professional in years, was taking care of her sick husband who recently passed away.   Hypertension: -Medications: Nothing currently, had been on Ramipril  -Checking BP at home (average): not checking but has cuff -Denies any SOB, CP, vision changes, LE edema or symptoms of hypotension  HLD: -Medications: Nothing currently  -Last lipid panel: Lipid Panel     Component Value Date/Time   CHOL 152 10/05/2016 0825   TRIG 50 10/05/2016 0825   HDL 53 10/05/2016 0825   CHOLHDL 3.4 06/02/2015 0908   LDLCALC 89 10/05/2016 0825   LABVLDL 10 10/05/2016 0825   Pre-Diabetes: -A1c 3/23 6.1% -Not currently on medications   GERD: -Currently on Pepcid 20 mg daily -Controls symptoms well  -Cannot take NSAID's because they exacerbated symptoms   MDD/Grief: -Mood status: exacerbated -Current treatment: Nothing  Psychotherapy/counseling: no  Previous psychiatric medications: buspar and lexapro - did really well with Lexapro, was only on 5 mg     06/01/2022    1:06 PM 02/20/2017    2:39 PM 01/18/2017    2:45 PM 11/08/2016    2:04 PM 08/09/2016   10:32 AM  Depression screen PHQ 2/9  Decreased Interest 2 0 0 0 0  Down, Depressed, Hopeless 2 0 0 0 0  PHQ - 2 Score 4 0 0 0 0  Altered sleeping 3      Tired, decreased energy 2      Change in appetite 1      Feeling bad or failure about yourself  3      Trouble concentrating 0      Moving slowly or fidgety/restless 0      Suicidal thoughts 1      PHQ-9 Score 14       Health Maintenance: -Blood work due -Mammogram due -Colon cancer screening due   Outpatient Encounter Medications as of 06/01/2022  Medication Sig   acetaminophen (TYLENOL) 500  MG tablet Take 1,000 mg by mouth in the morning and at bedtime.   Ascorbic Acid (VITAMIN C) 1000 MG tablet Take 1 tablet daily by mouth.   famotidine (PEPCID) 20 MG tablet Take 20 mg by mouth daily.   Zinc 10 MG LOZG Use as directed in the mouth or throat.   [DISCONTINUED] Zinc 10 MG LOZG Use as directed in the mouth or throat.   [DISCONTINUED] busPIRone (BUSPAR) 30 MG tablet Take 1 tablet (30 mg total) 2 (two) times daily by mouth.   [DISCONTINUED] doxycycline (VIBRAMYCIN) 100 MG capsule Take 1 capsule (100 mg total) by mouth 2 (two) times daily.   [DISCONTINUED] lovastatin (MEVACOR) 40 MG tablet TAKE 1 TABLET EACH EVENING   [DISCONTINUED] montelukast (SINGULAIR) 10 MG tablet TAKE 1 TABLET BY MOUTH EVERY DAY   [DISCONTINUED] promethazine-dextromethorphan (PROMETHAZINE-DM) 6.25-15 MG/5ML syrup Take 5 mLs by mouth 4 (four) times daily as needed for cough.   [DISCONTINUED] ramipril (ALTACE) 10 MG capsule TAKE 1 CAPSULE (10 MG TOTAL) BY MOUTH EVERY MORNING.   No facility-administered encounter medications on file as of 06/01/2022.    Past Medical History:  Diagnosis Date   Allergy    Year-round allergies and sinus infections.  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 Sees Chiropractor in [place].    GERD (gastroesophageal reflux disease)    History of genital warts    Had D and C   Hyperlipidemia    Hypertension     Past Surgical History:  Procedure Laterality Date   ESOPHAGOGASTRODUODENOSCOPY (EGD) WITH PROPOFOL N/A 03/08/2017   Procedure: ESOPHAGOGASTRODUODENOSCOPY (EGD) WITH PROPOFOL;  Surgeon: Virgel Manifold, MD;  Location: [facility];  Service: Endoscopy;  Laterality: N/A;   LESION EXCISION     genital warts   SEPTOPLASTY N/A 03/14/2017   Procedure: SEPTOPLASTY;  Surgeon: Margaretha Sheffield, MD;  Location: [facility];  Service: ENT;  Laterality: N/A;   [place] Bilateral 03/14/2017   Procedure: BILATERAL INFERIOR TURBINATE OUT-FRACTURE;  Surgeon: Margaretha Sheffield, MD;  Location: [facility];  Service: ENT;  Laterality: Bilateral;    Family History  Problem Relation Age of Onset   Thyroid disease Mother    Diabetes Mother    Cancer Father        lymph node cancer   Diabetes Father    Colon cancer Maternal Grandmother    Diabetes Brother    Thyroid disease Brother     Social History   Socioeconomic History   Marital status: Widowed    Spouse name: Not on file   Number of children: Not on file   Years of education: Not on file   Highest education level: Not on file  Occupational History   Not on file  Tobacco Use   Smoking status: Former    Packs/day: 0.50    Types: Cigarettes    Quit date: 06/24/2013    Years since quitting: 8.9   Smokeless tobacco: Never  Vaping Use   Vaping Use: Never used  Substance and Sexual Activity   Alcohol use: No   Drug use: No   Sexual activity: Not Currently  Other Topics Concern   Not on file  Social History Narrative   Not on file   Social Determinants of Health   Financial Resource Strain: Not on file  Food Insecurity: Not on file  Transportation Needs: Not on file  Physical Activity: Not on file  Stress: Not on file  Social Connections: Not on file  Intimate Partner Violence: Not on file    Review of Systems  Constitutional:  Negative for chills and fever.  Eyes:  Negative for blurred vision.  Respiratory:  Negative for shortness of breath.   Cardiovascular:  Negative for chest pain.  Gastrointestinal:  Positive for diarrhea.  Psychiatric/Behavioral:  Positive for depression.         Objective    BP (!) 174/102   Pulse 81   Temp 98.2 F (36.8 C)   Resp 16   Ht 5' 4.78" (1.645 m)   Wt 214 lb 3.2 oz (97.2 kg)   LMP 12/23/2016 (Approximate) Comment: period every 3 months due to  hormone therapy - preg test neg  SpO2 98%   BMI 35.89 kg/m   Physical Exam Constitutional:      Appearance: Normal appearance.  HENT:     Head: Normocephalic and atraumatic.  Eyes:     Conjunctiva/sclera: Conjunctivae normal.  Cardiovascular:     Rate and Rhythm: Normal  rate and regular rhythm.  Pulmonary:     Effort: Pulmonary effort is normal.     Breath sounds: Normal breath sounds.  Skin:    General: Skin is warm and dry.  Neurological:     General: No focal deficit present.     Mental Status: Morgan Gordon is alert. Mental status is at baseline.  Psychiatric:        Mood and Affect: Mood normal.        Behavior: Behavior normal.         Assessment & Plan:   1. Hypertension, unspecified type: Uncontrolled, start HCTZ 12.5 mg daily. Obtain CBC, CMP today. Follow up in 1 month to recheck.  - CBC w/Diff/Platelet - COMPLETE METABOLIC PANEL WITH GFR - hydrochlorothiazide (MICROZIDE) 12.5 MG capsule; Take 1 capsule (12.5 mg total) by mouth daily.  Dispense: 30 capsule; Refill: 1  2. Prediabetes: Check A1c.   - HgB A1c  3. Lipid screening: Check cholesterol.   - Lipid Profile  4. Current episode of major depressive disorder without prior episode, unspecified depression episode severity: Restart Lexapro at 10 mg daily, add Hydroxyzine to help with anxiety and hives.   - escitalopram (LEXAPRO) 10 MG tablet; Take 1 tablet (10 mg total) by mouth daily.  Dispense: 30 tablet; Refill: 1 - hydrOXYzine (ATARAX) 10 MG tablet; Take 1 tablet (10 mg total) by mouth 3 (three) times daily as needed.  Dispense: 30 tablet; Refill: 0  5. Gastroesophageal reflux disease, unspecified whether esophagitis present: table, doing well on Prilosec 20 mg OCT.   6. Diarrhea, unspecified type: Strong family history of Graves' disease, in both sister and mother, check TSH today.   - TSH  7. Encounter for hepatitis C screening test for low risk patient: Screening due.   - Hepatitis C Antibody  8.  Encounter for screening mammogram for malignant neoplasm of breast: Mammogram ordered.   - MM 3D SCREEN BREAST BILATERAL; Future  9. Screening for colon cancer: Cologuard ordered.   - Cologuard   Return in 4 weeks (on 06/29/2022).   Teodora Medici, DO

## 2022-06-01 NOTE — Patient Instructions (Addendum)
It was great seeing you today!  Plan discussed at today's visit: -Blood work ordered today, results will be uploaded to [facility]. Please return fasting for labs any day of the week. Hours are 8:30-11:0 and 2-4. -Start Lexapro 10 mg daily for anxiety/depression -Start Hydroxyzine as needed for hives/anxiety (can make you sleepy, start taking at night) -Start HCTZ 12.5 mg daily for blood pressure -Monitor BP at home, write down and bring to follow up   Follow up in: 1 month   Take care and let us know if you have any questions or concerns prior to your next visit.  Dr. Rosana Berger

## 2022-06-05 DIAGNOSIS — I10 Essential (primary) hypertension: Secondary | ICD-10-CM | POA: Diagnosis not present

## 2022-06-05 DIAGNOSIS — R197 Diarrhea, unspecified: Secondary | ICD-10-CM | POA: Diagnosis not present

## 2022-06-05 DIAGNOSIS — Z1159 Encounter for screening for other viral diseases: Secondary | ICD-10-CM | POA: Diagnosis not present

## 2022-06-05 DIAGNOSIS — Z13220 Encounter for screening for lipoid disorders: Secondary | ICD-10-CM | POA: Diagnosis not present

## 2022-06-05 DIAGNOSIS — R7303 Prediabetes: Secondary | ICD-10-CM | POA: Diagnosis not present

## 2022-06-06 LAB — COMPLETE METABOLIC PANEL WITH GFR
AG Ratio: 1.8 (calc) (ref 1.0–2.5)
ALT: 19 U/L (ref 6–29)
AST: 13 U/L (ref 10–35)
Albumin: 4.5 g/dL (ref 3.6–5.1)
Alkaline phosphatase (APISO): 60 U/L (ref 37–153)
BUN: 11 mg/dL (ref 7–25)
CO2: 27 mmol/L (ref 20–32)
Calcium: 9.3 mg/dL (ref 8.6–10.4)
Chloride: 102 mmol/L (ref 98–110)
Creat: 0.7 mg/dL (ref 0.50–1.03)
Globulin: 2.5 g/dL (calc) (ref 1.9–3.7)
Glucose, Bld: 97 mg/dL (ref 65–99)
Potassium: 4.3 mmol/L (ref 3.5–5.3)
Sodium: 139 mmol/L (ref 135–146)
Total Bilirubin: 0.4 mg/dL (ref 0.2–1.2)
Total Protein: 7 g/dL (ref 6.1–8.1)
eGFR: 105 mL/min/{1.73_m2} (ref >=60)

## 2022-06-06 LAB — CBC WITH DIFFERENTIAL/PLATELET
Absolute Monocytes: 800 cells/uL (ref 200–950)
Basophils Absolute: 93 cells/uL (ref 0–200)
Basophils Relative: 1 %
Eosinophils Absolute: 363 cells/uL (ref 15–500)
Eosinophils Relative: 3.9 %
HCT: 45.3 % — ABNORMAL HIGH (ref 35.0–45.0)
Hemoglobin: 15.3 g/dL (ref 11.7–15.5)
Lymphs Abs: 2204 cells/uL (ref 850–3900)
MCH: 27.6 pg (ref 27.0–33.0)
MCHC: 33.8 g/dL (ref 32.0–36.0)
MCV: 81.8 fL (ref 80.0–100.0)
MPV: 9.2 fL (ref 7.5–12.5)
Monocytes Relative: 8.6 %
Neutro Abs: 5840 cells/uL (ref 1500–7800)
Neutrophils Relative %: 62.8 %
Platelets: 363 10*3/uL (ref 140–400)
RBC: 5.54 10*6/uL — ABNORMAL HIGH (ref 3.80–5.10)
RDW: 13.5 % (ref 11.0–15.0)
Total Lymphocyte: 23.7 %
WBC: 9.3 10*3/uL (ref 3.8–10.8)

## 2022-06-06 LAB — LIPID PANEL
Cholesterol: 247 mg/dL — ABNORMAL HIGH (ref <200)
HDL: 43 mg/dL — ABNORMAL LOW (ref >=50)
LDL Cholesterol (Calc): 175 mg/dL (calc) — ABNORMAL HIGH
Non-HDL Cholesterol (Calc): 204 mg/dL (calc) — ABNORMAL HIGH (ref <130)
Total CHOL/HDL Ratio: 5.7 (calc) — ABNORMAL HIGH (ref <5.0)
Triglycerides: 148 mg/dL (ref <150)

## 2022-06-06 LAB — HEMOGLOBIN A1C
Hgb A1c MFr Bld: 6.3 % of total Hgb — ABNORMAL HIGH (ref <5.7)
Mean Plasma Glucose: 134 mg/dL
eAG (mmol/L): 7.4 mmol/L

## 2022-06-06 LAB — TSH: TSH: 2.28 mIU/L

## 2022-06-06 LAB — HEPATITIS C ANTIBODY: Hepatitis C Ab: NONREACTIVE

## 2022-06-07 ENCOUNTER — Telehealth: Payer: Self-pay | Admitting: Internal Medicine

## 2022-06-07 DIAGNOSIS — Z1211 Encounter for screening for malignant neoplasm of colon: Secondary | ICD-10-CM | POA: Diagnosis not present

## 2022-06-07 NOTE — Telephone Encounter (Signed)
Copied from [place] [id number]. Topic: General - Call Back - No Documentation >> Jun 07, 2022 10:53 AM Cyndi Bender wrote: Reason for CRM: Pt stated that she had a missed call from the office so she was returning the call. Cb# [phone]

## 2022-06-11 ENCOUNTER — Ambulatory Visit: Payer: Self-pay | Admitting: *Deleted

## 2022-06-11 NOTE — Telephone Encounter (Signed)
  Chief Complaint: Hypertension Symptoms: Seen 06/01/22, placed on HCTZ.   BP ranging 160's/90's. Yesterday 170/90 Frequency: Since seen at [facility] Pertinent Negatives: Patient denies dizziness, headache presently. Disposition: '[]'$ ED /'[]'$ Urgent Care (no appt availability in office) / '[]'$ Appointment(In office/virtual)/ '[]'$  [facility]/ '[]'$ Home Care/ '[]'$ Refused Recommended Disposition /'[]'$ [facility]/ '[x]'$  Follow-up with PCP Additional Notes: Pt states "I feel the medication isn't working." States after work feels heart bounding, chest and neck pressure "When I lay down after work." States was told to call if BP remains elevated. Please advise Reason for Disposition  Systolic BP  >= 0000000 OR Diastolic >= [id number]  Answer Assessment - Initial Assessment Questions 1. BLOOD PRESSURE: "What is the blood pressure?" "Did you take at least two measurements 5 minutes apart?"     170/99  and 160/90   163/90 2. ONSET: "When did you take your blood pressure?"     Last several days 3. HOW: "How did you take your blood pressure?" (e.g., automatic home BP monitor, visiting nurse)     Home monitor 4. HISTORY: "Do you have a history of high blood pressure?"     Yes 5. MEDICINES: "Are you taking any medicines for blood pressure?" "Have you missed any doses recently?"     Yes, no missed doses 6. OTHER SYMPTOMS: "Do you have any symptoms?" (e.g., blurred vision, chest pain, difficulty breathing, headache, weakness)     Headaches at times, dizzy in past, not presently (Last summer)  Protocols used: Blood Pressure - High-A-AH

## 2022-06-12 ENCOUNTER — Encounter: Payer: Self-pay | Admitting: Internal Medicine

## 2022-06-12 ENCOUNTER — Ambulatory Visit: Payer: 59 | Admitting: Internal Medicine

## 2022-06-12 VITALS — BP 182/96 | HR 90 | Resp 16 | Ht 64.0 in | Wt 214.0 lb

## 2022-06-12 DIAGNOSIS — E782 Mixed hyperlipidemia: Secondary | ICD-10-CM | POA: Diagnosis not present

## 2022-06-12 DIAGNOSIS — I10 Essential (primary) hypertension: Secondary | ICD-10-CM | POA: Diagnosis not present

## 2022-06-12 DIAGNOSIS — R7303 Prediabetes: Secondary | ICD-10-CM

## 2022-06-12 MED ORDER — LISINOPRIL 20 MG PO TABS: 20.0000 mg | ORAL_TABLET | Freq: Every day | ORAL | 0 refills | Status: DC

## 2022-06-12 MED ORDER — ATORVASTATIN CALCIUM 10 MG PO TABS: 10.0000 mg | ORAL_TABLET | Freq: Every day | ORAL | 1 refills | Status: DC

## 2022-06-12 MED ORDER — HYDROCHLOROTHIAZIDE 25 MG PO TABS: 25.0000 mg | ORAL_TABLET | Freq: Every day | ORAL | 0 refills | Status: DC

## 2022-06-12 NOTE — Patient Instructions (Addendum)
It was great seeing you today!  Plan discussed at today's visit: -Increase HCTZ to 25 mg -Start Lisinopril at 20 mg daily as well for blood pressure -Continue to monitor blood pressure at home -Start Lipitor 10 mg daily for cholesterol  -Work on decreasing sugar and carbs in the diet   Follow up in: already scheduled   Take care and let us know if you have any questions or concerns prior to your next visit.  Dr. Rosana Berger  Lisinopril; Hydrochlorothiazide Tablets What is this medication? LISINOPRIL; HYDROCHLOROTHIAZIDE (lyse IN oh pril; hye droe klor oh THYE a zide) treats high blood pressure. It relaxes your blood vessels and helps your kidneys remove more fluid through the urine, which lowers blood pressure. This medication is a combination of an ACE inhibitor and diuretic. This medicine may be used for other purposes; ask your health care provider or pharmacist if you have questions. COMMON BRAND NAME(S): Prinzide, Zestoretic What should I tell my care team before I take this medication? They need to know if you have any of these conditions: Bone marrow disease Decreased urine Diabetes Heart or blood vessel disease If you are on a special diet like a low salt diet Immune system problems, like lupus Kidney disease Liver disease Previous swelling of the tongue, face, or lips with difficulty breathing, difficulty swallowing, hoarseness, or tightening of the throat Recent heart attack or stroke An unusual or allergic reaction to lisinopril, hydrochlorothiazide, sulfa medications, other medications, insect venom, foods, dyes, or preservatives Pregnant or trying to get pregnant Breast-feeding How should I use this medication? Take this medication by mouth. Take it as directed on the prescription label at the same time every day. You can take it with or without food. If it upsets your stomach, take it with food. Keep taking it unless your care team tells you to stop. Talk to your care  team about the use of this drug in children. Special care may be needed. Overdosage: If you think you have taken too much of this medicine contact a poison control center or emergency room at once. NOTE: This medicine is only for you. Do not share this medicine with others. What if I miss a dose? If you miss a dose, take it as soon as you can. If it is almost time for your next dose, take only that dose. Do not take double or extra doses. What may interact with this medication? Do not take this medication with any of the following: Cidofovir Dofetilide Sacubitril; valsartan Tranylcypromine This medication may also interact with the following: Barbiturates like phenobarbital Blood pressure medications Celecoxib Corticosteroids like prednisone Diuretics, especially triamterene, spironolactone or amiloride Lithium Medications for diabetes Medications that relax the muscles for surgery Narcotic medications for pain NSAIDs, medications for pain and inflammation, like ibuprofen or naproxen Potassium salts or potassium supplements Some cholesterol lowering medications like cholestyramine or colestipol This list may not describe all possible interactions. Give your health care provider a list of all the medicines, herbs, non-prescription drugs, or dietary supplements you use. Also tell them if you smoke, drink alcohol, or use illegal drugs. Some items may interact with your medicine. What should I watch for while using this medication? Visit your care team for regular checks on your progress. Check your blood pressure as directed. Ask your care team what your blood pressure should be and when you should contact him or her. Call your care team if you notice an irregular or fast heartbeat. You must not get dehydrated.  Ask your care team how much fluid you need to drink a day. Check with them if you get an attack of severe diarrhea, nausea and vomiting, or if you sweat a lot. The loss of too much  body fluid can make it dangerous for you to take this medication. Women should inform their care team if they wish to become pregnant or think they might be pregnant. There is a potential for serious side effects to an unborn child. Talk to your care team or pharmacist for more information. You may get drowsy or dizzy. Do not drive, use machinery, or do anything that needs mental alertness until you know how this medication affects you. Do not stand or sit up quickly, especially if you are an older patient. This reduces the risk of dizzy or fainting spells. Alcohol can make you more drowsy and dizzy. Avoid alcoholic drinks. This medication may increase blood sugar. Ask your care team if changes in diet or medications are needed if you have diabetes. Avoid salt substitutes unless you are told otherwise by your care team. Talk to your care team about your risk of skin cancer. You may be more at risk for skin cancer if you take this medication. This medication can make you more sensitive to the sun. Keep out of the sun. If you cannot avoid being in the sun, wear protective clothing and use sunscreen. Do not use sun lamps or tanning beds/booths. Do not treat yourself for coughs, colds, or pain while you are taking this medication without asking your care team for advice. Some ingredients may increase your blood pressure. What side effects may I notice from receiving this medication? Side effects that you should report to your care team as soon as possible: Allergic reactions or angioedema--skin rash, itching or hives, swelling of the face eyes, lips, tongue, arms, or legs, trouble swallowing or breathing Dehydration--increased thirst, dry mouth, feeling faint or lightheaded, headache, dark yellow or brown urine Gout--severe pain, redness, warmth, or swelling in joints, such as the big toe Kidney injury--decrease in the amount of urine, swelling of the ankles, hands, or feet Liver injury--right upper belly  pain, loss of appetite, nausea, light-colored stool, dark yellow or brown urine, yellowing skin or eyes, unusual weakness or fatigue Low blood pressure--dizziness, feeling faint or lightheaded, blurry vision Low potassium level--muscle pain or cramps, unusual weakness, fatigue, fast or irregular heartbeat, constipation Sudden eye pain or change in vision such as blurred vision, seeing halos around lights, vision loss Side effects that usually do not require medical attention (report to your care team if they continue or are bothersome): Change in sex drive or performance Cough Dizziness Headache Upset stomach This list may not describe all possible side effects. Call your doctor for medical advice about side effects. You may report side effects to FDA at 1-800-FDA-1088. Where should I keep my medication? Keep out of the reach of children and pets. Store at room temperature between 20 and 25 degrees C (68 and 77 degrees F). Protect from light and moisture. Keep the container tightly closed. Throw away any unused medication after the expiration date. NOTE: This sheet is a summary. It may not cover all possible information. If you have questions about this medicine, talk to your doctor, pharmacist, or health care provider.  2023 Elsevier/Gold Standard (2007-05-17 00:00:00)  Atorvastatin Tablets What is this medication? ATORVASTATIN (a TORE va sta tin) treats high cholesterol and reduces the risk of heart attack and stroke. It works by decreasing bad  cholesterol and fats (such as LDL, triglycerides) and increasing good cholesterol (HDL) in your blood. It belongs to a group of medications called statins. Changes to diet and exercise are often combined with this medication. This medicine may be used for other purposes; ask your health care provider or pharmacist if you have questions. COMMON BRAND NAME(S): Lipitor What should I tell my care team before I take this medication? They need to know if  you have any of these conditions: Diabetes Frequently drink alcohol Kidney disease Liver disease Muscle cramps, pain Stroke Thyroid disease An unusual or allergic reaction to atorvastatin, other medications, foods, dyes, or preservatives Pregnant or trying to get pregnant Breastfeeding How should I use this medication? Take this medication by mouth. Take it as directed on the label at the same time every day. You can take it with or without food. If it upsets your stomach, take it with food. Keep taking it unless your care team tells you to stop. Do not take this medication with grapefruit juice. Talk to your care team about the use of this medication in children. While it may be prescribed for children as young as 10 years for selected conditions, precautions do apply. Overdosage: If you think you have taken too much of this medicine contact a poison control center or emergency room at once. NOTE: This medicine is only for you. Do not share this medicine with others. What if I miss a dose? If you miss a dose, take it as soon as you can unless it is more than 12 hours late. If it is more than 12 hours late, skip the missed dose. Take the next dose at the normal time. If it is almost time for your next dose, take only that dose. Do not take double or extra doses. What may interact with this medication? Do not take this medication with any of the following: Dasabuvir; ombitasvir; paritaprevir; ritonavir Lonafarnib Ombitasvir; paritaprevir; ritonavir Posaconazole Red yeast rice This medication may also interact with the following: Alcohol Certain antibiotics, such as erythromycin, clarithromycin Certain antivirals for HIV or hepatitis Certain medications for cholesterol, such as fenofibrate, gemfibrozil, niacin Certain medications for fungal infections, such as ketoconazole, itraconazole, voriconazole Colchicine Cyclosporine Digoxin Estrogen or progestin hormones Grapefruit  juice Rifampin This list may not describe all possible interactions. Give your health care provider a list of all the medicines, herbs, non-prescription drugs, or dietary supplements you use. Also tell them if you smoke, drink alcohol, or use illegal drugs. Some items may interact with your medicine. What should I watch for while using this medication? Visit your care team for regular checks on your progress. Tell your care team if your symptoms do not start to get better or if they get worse. Your care team may tell you to stop taking this medication if you develop muscle problems. If your muscle problems do not go away after stopping this medication, contact your care team. This medication may increase blood sugar. The risk may be higher in patients who already have diabetes. Ask your care team what you can do to lower your risk of diabetes while on this medication. If you are going to need surgery or other procedure, tell your care team that you are using this medication. Taking this medication is only part of a total heart healthy program. Ask your care team if there are other changes you can make to improve your overall health. Drinking more than 2 alcoholic drinks every day with this medication can increase  the risk of side effects. Talk to your care team if you wish to become pregnant or think you might be pregnant. This medication can cause serious birth defects. Talk to your care team before breastfeeding. Changes to your treatment plan may be needed. What side effects may I notice from receiving this medication? Side effects that you should report to your care team as soon as possible: Allergic reactions--skin rash, itching, hives, swelling of the face, lips, tongue, or throat High blood sugar (hyperglycemia)--increased thirst or amount of urine, unusual weakness or fatigue, blurry vision Liver injury--right upper belly pain, loss of appetite, nausea, light-colored stool, dark yellow or  brown urine, yellowing skin or eyes, unusual weakness or fatigue Muscle injury--unusual weakness or fatigue, muscle pain, dark yellow or brown urine, decrease in amount of urine Redness, blistering, peeling, or loosening of the skin, including inside the mouth Side effects that usually do not require medical attention (report to your care team if they continue or are bothersome): Diarrhea Nausea Trouble sleeping Upset stomach This list may not describe all possible side effects. Call your doctor for medical advice about side effects. You may report side effects to FDA at 1-800-FDA-1088. Where should I keep my medication? Keep out of the reach of children and pets. Store at room temperature between 20 and 25 degrees C (68 and 77 degrees F). Get rid of any unused medication after the expiration date. To get rid of medications that are no longer needed or have expired: Take the medication to a medication take-back program. Check with your pharmacy or law enforcement to find a location. If you cannot return the medication, check the label or package insert to see if the medication should be thrown out in the garbage or flushed down the toilet. If you are not sure, ask your care team. If it is safe to put it in the trash, take the medication out of the container. Mix the medication with cat litter, dirt, coffee grounds, or other unwanted substance. Seal the mixture in a bag or container. Put it in the trash. NOTE: This sheet is a summary. It may not cover all possible information. If you have questions about this medicine, talk to your doctor, pharmacist, or health care provider.  2023 Elsevier/Gold Standard (2007-05-17 00:00:00)

## 2022-06-12 NOTE — Telephone Encounter (Signed)
Appt sch'd for this afternoon

## 2022-06-12 NOTE — Progress Notes (Signed)
Established Patient Office Visit  Subjective    Patient ID: Morgan Gordon, female    DOB: January 02, 1971  Age: 52 y.o. MRN: [id number]  CC:  Chief Complaint  Patient presents with   Hypertension    HPI Morgan Gordon presents to discuss blood pressure. She is here with her mother.   Hypertension: -Medications: Had been on Ramipril in the past, was started on HCTZ 12.5 mg at LOV and is tolerating it well but BP not controlled.  -Checking BP at home (average): 170-190/90-105 -Denies any SOB, CP, vision changes, LE edema or symptoms of hypotension  HLD: -Medications: Nothing currently  -Last lipid panel: Lipid Panel     Component Value Date/Time   CHOL 247 (H) 06/05/2022 1001   CHOL 152 10/05/2016 0825   TRIG 148 06/05/2022 1001   HDL 43 (L) 06/05/2022 1001   HDL 53 10/05/2016 0825   CHOLHDL 5.7 (H) 06/05/2022 1001   LDLCALC 175 (H) 06/05/2022 1001   LABVLDL 10 10/05/2016 0825   The 10-year ASCVD risk score (Arnett DK, et al., 2019) is: 6.4%   Values used to calculate the score:     Age: 39 years     Sex: Female     Is Non-Hispanic African American: No     Diabetic: No     Tobacco smoker: No     Systolic Blood Pressure: [id number] mmHg     Is BP treated: Yes     HDL Cholesterol: 43 mg/dL     Total Cholesterol: 247 mg/dL  Pre-Diabetes: -A1c 3/23 6.1% -Not currently on medications   GERD: -Currently on Pepcid 20 mg daily -Controls symptoms well  -Cannot take NSAID's because they exacerbated symptoms   Health Maintenance: -Blood work due -Mammogram due -Colon cancer screening due   Outpatient Encounter Medications as of 06/12/2022  Medication Sig   acetaminophen (TYLENOL) 500 MG tablet Take 1,000 mg by mouth in the morning and at bedtime.   Ascorbic Acid (VITAMIN C) 1000 MG tablet Take 1 tablet daily by mouth.   escitalopram (LEXAPRO) 10 MG tablet Take 1 tablet (10 mg total) by mouth daily.   famotidine (PEPCID) 20 MG tablet Take 20 mg by mouth daily.    hydrochlorothiazide (MICROZIDE) 12.5 MG capsule Take 1 capsule (12.5 mg total) by mouth daily.   hydrOXYzine (ATARAX) 10 MG tablet Take 1 tablet (10 mg total) by mouth 3 (three) times daily as needed.   Zinc 10 MG LOZG Use as directed in the mouth or throat.   No facility-administered encounter medications on file as of 06/12/2022.    Past Medical History:  Diagnosis Date   Allergy    Year-round allergies and sinus infections.   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 Sees Chiropractor in [place].    GERD (gastroesophageal reflux disease)    History of genital warts    Had D and C   Hyperlipidemia    Hypertension     Past Surgical History:  Procedure Laterality Date   ESOPHAGOGASTRODUODENOSCOPY (EGD) WITH PROPOFOL N/A 03/08/2017   Procedure: ESOPHAGOGASTRODUODENOSCOPY (EGD) WITH PROPOFOL;  Surgeon: Virgel Manifold, MD;  Location: [facility];  Service: Endoscopy;  Laterality: N/A;   LESION EXCISION     genital warts   SEPTOPLASTY N/A 03/14/2017   Procedure:  SEPTOPLASTY;  Surgeon: Margaretha Sheffield, MD;  Location: [facility];  Service: ENT;  Laterality: N/A;   Daniel Bilateral 03/14/2017   Procedure: BILATERAL INFERIOR TURBINATE OUT-FRACTURE;  Surgeon: Margaretha Sheffield, MD;  Location: [facility];  Service: ENT;  Laterality: Bilateral;    Family History  Problem Relation Age of Onset   Thyroid disease Mother    Diabetes Mother    Cancer Father        lymph node cancer   Diabetes Father    Colon cancer Maternal Grandmother    Diabetes Brother    Thyroid disease Brother     Social History   Socioeconomic History   Marital status: Widowed    Spouse name: Not on file   Number of children: Not on file   Years of education: Not on file   Highest education level: Not on  file  Occupational History   Not on file  Tobacco Use   Smoking status: Former    Packs/day: 0.50    Types: Cigarettes    Quit date: 06/24/2013    Years since quitting: 8.9   Smokeless tobacco: Never  Vaping Use   Vaping Use: Never used  Substance and Sexual Activity   Alcohol use: No   Drug use: No   Sexual activity: Not Currently  Other Topics Concern   Not on file  Social History Narrative   Not on file   Social Determinants of Health   Financial Resource Strain: Not on file  Food Insecurity: Not on file  Transportation Needs: Not on file  Physical Activity: Not on file  Stress: Not on file  Social Connections: Not on file  Intimate Partner Violence: Not on file    Review of Systems  Constitutional:  Negative for chills and fever.  Eyes:  Negative for blurred vision.  Respiratory:  Negative for shortness of breath.   Cardiovascular:  Negative for chest pain.  Neurological:  Negative for dizziness.       Objective    BP (!) 182/96   Pulse 90   Resp 16   Ht '5\' 4"'$  (1.626 m)   Wt 214 lb (97.1 kg)   LMP 12/23/2016 (Approximate) Comment: period every 3 months due to hormone therapy - preg test neg  SpO2 96%   BMI 36.73 kg/m   Physical Exam Constitutional:      Appearance: Normal appearance.  HENT:     Head: Normocephalic and atraumatic.  Eyes:     Conjunctiva/sclera: Conjunctivae normal.  Cardiovascular:     Rate and Rhythm: Normal rate and regular rhythm.  Pulmonary:     Effort: Pulmonary effort is normal.     Breath sounds: Normal breath sounds.  Skin:    General: Skin is warm and dry.  Neurological:     General: No focal deficit present.     Mental Status: She is alert. Mental status is at baseline.  Psychiatric:        Mood and Affect: Mood normal.        Behavior: Behavior normal.         Assessment & Plan:   1. Primary hypertension: BP uncontrolled. Increase HCTZ to 25 mg daily, add Lisinopril 20 mg daily as well. Continue to monitor  at home. Recheck at follow up in 1 month. Discussed all potential side effects with medications, information printed for the patient.   - lisinopril (ZESTRIL) 20 MG tablet; Take 1 tablet (20 mg total)  by mouth daily.  Dispense: 90 tablet; Refill: 0 - hydrochlorothiazide (HYDRODIURIL) 25 MG tablet; Take 1 tablet (25 mg total) by mouth daily.  Dispense: 90 tablet; Refill: 0  2. Mixed hyperlipidemia: Reviewed patient's lipid panel with her, ASCVD risk borderline at 6.5. Patient had been on Lovastatin in the past, will start Lipitor 10 mg daily.   - atorvastatin (LIPITOR) 10 MG tablet; Take 1 tablet (10 mg total) by mouth daily.  Dispense: 90 tablet; Refill: 1  3. Prediabetes: A1c 6.3%, will discussing potentially starting Metformin in the future. For now, work on decreasing sugar and carbs in the diet, plan to recheck in 3-6 months.   Return for already scheduled .   Teodora Medici, DO

## 2022-06-13 ENCOUNTER — Ambulatory Visit
Admission: RE | Admit: 2022-06-13 | Discharge: 2022-06-13 | Disposition: A | Discharge status: Home / Self Care | Payer: 59 | Source: Ambulatory Visit | Attending: Internal Medicine | Admitting: Internal Medicine

## 2022-06-13 DIAGNOSIS — Z1231 Encounter for screening mammogram for malignant neoplasm of breast: Secondary | ICD-10-CM | POA: Diagnosis not present

## 2022-06-15 ENCOUNTER — Other Ambulatory Visit: Payer: Self-pay

## 2022-06-15 DIAGNOSIS — Z1231 Encounter for screening mammogram for malignant neoplasm of breast: Secondary | ICD-10-CM

## 2022-06-15 NOTE — Progress Notes (Signed)
Mammogram ordered for 1 year

## 2022-06-17 LAB — COLOGUARD: COLOGUARD: NEGATIVE

## 2022-06-23 ENCOUNTER — Other Ambulatory Visit: Payer: Self-pay | Admitting: Internal Medicine

## 2022-06-23 DIAGNOSIS — F329 Major depressive disorder, single episode, unspecified: Secondary | ICD-10-CM

## 2022-06-25 NOTE — Telephone Encounter (Signed)
Requested medication (s) are due for refill today: Yes  Requested medication (s) are on the active medication list: Yes  Last refill:  06/01/22  Future visit scheduled:   Notes to clinic:  Pharmacy requests 90 day supply and diagnosis code.    Requested Prescriptions  Pending Prescriptions Disp Refills   escitalopram (LEXAPRO) 10 MG tablet [Pharmacy Med Name: ESCITALOPRAM 10 MG TABLET] 90 tablet 1    Sig: TAKE 1 TABLET BY MOUTH EVERY DAY     Psychiatry:  Antidepressants - SSRI Passed - 06/23/2022  9:31 AM      Passed - Valid encounter within last 6 months    Recent Outpatient Visits           1 week ago Primary hypertension   [place], DO   3 weeks ago Hypertension, unspecified type   [facility] Teodora Medici, DO   5 years ago [place], [place], MD   5 years ago Otalgia of left ear   [place], Satira Anis, MD   5 years ago Essential hypertension   [facility] Roselee Nova, MD       Future Appointments             In 2 weeks Teodora Medici, [facility], [facility]

## 2022-06-26 ENCOUNTER — Other Ambulatory Visit: Payer: Self-pay | Admitting: Internal Medicine

## 2022-06-26 DIAGNOSIS — F329 Major depressive disorder, single episode, unspecified: Secondary | ICD-10-CM

## 2022-06-27 ENCOUNTER — Other Ambulatory Visit: Payer: Self-pay

## 2022-06-27 DIAGNOSIS — F329 Major depressive disorder, single episode, unspecified: Secondary | ICD-10-CM

## 2022-06-27 MED ORDER — HYDROXYZINE HCL 10 MG PO TABS: 10.0000 mg | ORAL_TABLET | Freq: Three times a day (TID) | ORAL | 0 refills | Status: DC | PRN

## 2022-06-27 NOTE — Telephone Encounter (Signed)
Refilled 06/12/22 #90.

## 2022-06-27 NOTE — Telephone Encounter (Signed)
Pt calling to follow up on her medication refill. She stated she will be out by this coming Friday, and she only received a 30-day supply the last time she picked it up.   Please advise.

## 2022-06-27 NOTE — Telephone Encounter (Signed)
Requested Prescriptions  Pending Prescriptions Disp Refills   hydrOXYzine (ATARAX) 10 MG tablet 30 tablet 0    Sig: Take 1 tablet (10 mg total) by mouth 3 (three) times daily as needed.     Ear, Nose, and Throat:  Antihistamines 2 Passed - 06/27/2022 10:20 AM      Passed - Cr in normal range and within 360 days    Creat  Date Value Ref Range Status  06/05/2022 0.70 0.50 - 1.03 mg/dL Final         Passed - Valid encounter within last 12 months    Recent Outpatient Visits           2 weeks ago Primary hypertension   [facility] Teodora Medici, DO   3 weeks ago Hypertension, unspecified type   [facility] Teodora Medici, DO   5 years ago [place], [place], MD   5 years ago Otalgia of left ear   [place], Satira Anis, MD   5 years ago Essential hypertension   [place], MD       Future Appointments             In 2 weeks Teodora Medici, [facility], [facility]            Refused Prescriptions Disp Refills   hydrOXYzine (ATARAX) 10 MG tablet [Pharmacy Med Name: HYDROXYZINE HCL 10 MG TABLET] 30 tablet 0    Sig: TAKE 1 TABLET BY MOUTH THREE TIMES A DAY AS NEEDED     Ear, Nose, and Throat:  Antihistamines 2 Passed - 06/27/2022 10:20 AM      Passed - Cr in normal range and within 360 days    Creat  Date Value Ref Range Status  06/05/2022 0.70 0.50 - 1.03 mg/dL Final         Passed - Valid encounter within last 12 months    Recent Outpatient Visits           2 weeks ago Primary hypertension   [place], DO   3 weeks ago Hypertension, unspecified type   [facility] Teodora Medici, DO   5 years ago [place], MD   5 years  ago Otalgia of left ear   [place], Satira Anis, MD   5 years ago Essential hypertension   [facility] Roselee Nova, MD       Future Appointments             In 2 weeks Teodora Medici, [facility], [facility]

## 2022-07-11 NOTE — Progress Notes (Unsigned)
Established Patient Office Visit  Subjective    Patient ID: Morgan Gordon, female    DOB: 1970/05/28  Age: 52 y.o. MRN: [id number]  CC:  No chief complaint on file.   HPI Morgan Gordon presents to discuss blood pressure. She is here with her mother.   Hypertension: -Medications: HCTZ increased to 25 mg at LOV, added Lisinopril 20 mg -Checking BP at home (average): 170-190/90-105 -Denies any SOB, CP, vision changes, LE edema or symptoms of hypotension  HLD: -Medications: Lipitor 10 mg -Last lipid panel: Lipid Panel     Component Value Date/Time   CHOL 247 (H) 06/05/2022 1001   CHOL 152 10/05/2016 0825   TRIG 148 06/05/2022 1001   HDL 43 (L) 06/05/2022 1001   HDL 53 10/05/2016 0825   CHOLHDL 5.7 (H) 06/05/2022 1001   LDLCALC 175 (H) 06/05/2022 1001   LABVLDL 10 10/05/2016 0825   The 10-year ASCVD risk score (Arnett DK, et al., 2019) is: 6.4%   Values used to calculate the score:     Age: 57 years     Sex: Female     Is Non-Hispanic African American: No     Diabetic: No     Tobacco smoker: No     Systolic Blood Pressure: [id number] mmHg     Is BP treated: Yes     HDL Cholesterol: 43 mg/dL     Total Cholesterol: 247 mg/dL  Pre-Diabetes: -A1c 3/23 6.1% -Not currently on medications   GERD: -Currently on Pepcid 20 mg daily -Controls symptoms well  -Cannot take NSAID's because they exacerbated symptoms   Health Maintenance: -Blood work due -Mammogram due -Colon cancer screening due   Outpatient Encounter Medications as of 07/12/2022  Medication Sig   acetaminophen (TYLENOL) 500 MG tablet Take 1,000 mg by mouth in the morning and at bedtime.   Ascorbic Acid (VITAMIN C) 1000 MG tablet Take 1 tablet daily by mouth.   atorvastatin (LIPITOR) 10 MG tablet Take 1 tablet (10 mg total) by mouth daily.   escitalopram (LEXAPRO) 10 MG tablet TAKE 1 TABLET BY MOUTH EVERY DAY   famotidine (PEPCID) 20 MG tablet Take 20 mg by mouth daily.   hydrochlorothiazide  (HYDRODIURIL) 25 MG tablet Take 1 tablet (25 mg total) by mouth daily.   hydrOXYzine (ATARAX) 10 MG tablet Take 1 tablet (10 mg total) by mouth 3 (three) times daily as needed.   lisinopril (ZESTRIL) 20 MG tablet Take 1 tablet (20 mg total) by mouth daily.   Zinc 10 MG LOZG Use as directed in the mouth or throat.   No facility-administered encounter medications on file as of 07/12/2022.    Past Medical History:  Diagnosis Date   Allergy    Year-round allergies and sinus infections.   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 Sees Chiropractor in [place].    GERD (gastroesophageal reflux disease)    History of genital warts    Had D and C   Hyperlipidemia    Hypertension     Past Surgical History:  Procedure Laterality Date   ESOPHAGOGASTRODUODENOSCOPY (EGD) WITH PROPOFOL N/A 03/08/2017   Procedure: ESOPHAGOGASTRODUODENOSCOPY (EGD) WITH PROPOFOL;  Surgeon: Virgel Manifold, MD;  Location: [facility];  Service: Endoscopy;  Laterality: N/A;   LESION EXCISION  genital warts   SEPTOPLASTY N/A 03/14/2017   Procedure: SEPTOPLASTY;  Surgeon: Margaretha Sheffield, MD;  Location: [place];  Service: ENT;  Laterality: N/A;   [place] Bilateral 03/14/2017   Procedure: BILATERAL INFERIOR TURBINATE OUT-FRACTURE;  Surgeon: Margaretha Sheffield, MD;  Location: [place];  Service: ENT;  Laterality: Bilateral;    Family History  Problem Relation Age of Onset   Thyroid disease Mother    Diabetes Mother    Cancer Father        lymph node cancer   Diabetes Father    Colon cancer Maternal Grandmother    Diabetes Brother    Thyroid disease Brother     Social History   Socioeconomic History   Marital status: Widowed    Spouse name: Not on file   Number of children: Not on file   Years of  education: Not on file   Highest education level: Not on file  Occupational History   Not on file  Tobacco Use   Smoking status: Former    Packs/day: .5    Types: Cigarettes    Quit date: 06/24/2013    Years since quitting: 9.0   Smokeless tobacco: Never  Vaping Use   Vaping Use: Never used  Substance and Sexual Activity   Alcohol use: No   Drug use: No   Sexual activity: Not Currently  Other Topics Concern   Not on file  Social History Narrative   Not on file   Social Determinants of Health   Financial Resource Strain: Not on file  Food Insecurity: Not on file  Transportation Needs: Not on file  Physical Activity: Not on file  Stress: Not on file  Social Connections: Not on file  Intimate Partner Violence: Not on file    Review of Systems  Constitutional:  Negative for chills and fever.  Eyes:  Negative for blurred vision.  Respiratory:  Negative for shortness of breath.   Cardiovascular:  Negative for chest pain.  Neurological:  Negative for dizziness.       Objective    LMP 12/23/2016 (Approximate) Comment: period every 3 months due to hormone therapy - preg test neg  Physical Exam Constitutional:      Appearance: Normal appearance.  HENT:     Head: Normocephalic and atraumatic.  Eyes:     Conjunctiva/sclera: Conjunctivae normal.  Cardiovascular:     Rate and Rhythm: Normal rate and regular rhythm.  Pulmonary:     Effort: Pulmonary effort is normal.     Breath sounds: Normal breath sounds.  Skin:    General: Skin is warm and dry.  Neurological:     General: No focal deficit present.     Mental Status: She is alert. Mental status is at baseline.  Psychiatric:        Mood and Affect: Mood normal.        Behavior: Behavior normal.         Assessment & Plan:   1. Primary hypertension: BP uncontrolled. Increase HCTZ to 25 mg daily, add Lisinopril 20 mg daily as well. Continue to monitor at home. Recheck at follow up in 1 month. Discussed all  potential side effects with medications, information printed for the patient.   - lisinopril (ZESTRIL) 20 MG tablet; Take 1 tablet (20 mg total) by mouth daily.  Dispense: 90 tablet; Refill: 0 - hydrochlorothiazide (HYDRODIURIL) 25 MG tablet; Take 1 tablet (25 mg total) by mouth daily.  Dispense:  90 tablet; Refill: 0  2. Mixed hyperlipidemia: Reviewed patient's lipid panel with her, ASCVD risk borderline at 6.5. Patient had been on Lovastatin in the past, will start Lipitor 10 mg daily.   - atorvastatin (LIPITOR) 10 MG tablet; Take 1 tablet (10 mg total) by mouth daily.  Dispense: 90 tablet; Refill: 1  3. Prediabetes: A1c 6.3%, will discussing potentially starting Metformin in the future. For now, work on decreasing sugar and carbs in the diet, plan to recheck in 3-6 months.   No follow-ups on file.   Teodora Medici, DO

## 2022-07-12 ENCOUNTER — Ambulatory Visit (INDEPENDENT_AMBULATORY_CARE_PROVIDER_SITE_OTHER): Payer: 59 | Admitting: Internal Medicine

## 2022-07-12 ENCOUNTER — Other Ambulatory Visit (HOSPITAL_COMMUNITY)
Admission: RE | Admit: 2022-07-12 | Discharge: 2022-07-12 | Disposition: A | Discharge status: Home / Self Care | Payer: 59 | Source: Ambulatory Visit | Attending: Internal Medicine | Admitting: Internal Medicine

## 2022-07-12 ENCOUNTER — Encounter: Payer: Self-pay | Admitting: Internal Medicine

## 2022-07-12 VITALS — BP 136/82 | HR 83 | Temp 98.1°F | Resp 18 | Ht 64.0 in | Wt 214.6 lb

## 2022-07-12 DIAGNOSIS — Z124 Encounter for screening for malignant neoplasm of cervix: Secondary | ICD-10-CM | POA: Insufficient documentation

## 2022-07-12 DIAGNOSIS — E782 Mixed hyperlipidemia: Secondary | ICD-10-CM | POA: Diagnosis not present

## 2022-07-12 DIAGNOSIS — I10 Essential (primary) hypertension: Secondary | ICD-10-CM

## 2022-07-12 DIAGNOSIS — F329 Major depressive disorder, single episode, unspecified: Secondary | ICD-10-CM

## 2022-07-12 DIAGNOSIS — Z122 Encounter for screening for malignant neoplasm of respiratory organs: Secondary | ICD-10-CM

## 2022-07-12 DIAGNOSIS — Z0000 Encounter for general adult medical examination without abnormal findings: Secondary | ICD-10-CM

## 2022-07-12 DIAGNOSIS — D229 Melanocytic nevi, unspecified: Secondary | ICD-10-CM | POA: Diagnosis not present

## 2022-07-12 MED ORDER — HYDROXYZINE HCL 10 MG PO TABS: 10.0000 mg | ORAL_TABLET | Freq: Two times a day (BID) | ORAL | 3 refills | Status: DC | PRN

## 2022-07-13 LAB — CYTOLOGY - PAP
Adequacy: ABSENT
Comment: NEGATIVE
Diagnosis: NEGATIVE
High risk HPV: NEGATIVE

## 2022-08-05 ENCOUNTER — Other Ambulatory Visit: Payer: Self-pay | Admitting: Internal Medicine

## 2022-08-05 DIAGNOSIS — F329 Major depressive disorder, single episode, unspecified: Secondary | ICD-10-CM

## 2022-08-07 NOTE — Telephone Encounter (Signed)
Requested medication (s) are due for refill today: No  Requested medication (s) are on the active medication list: Yes  Last refill:  07/12/22 #60 with 3 refills  Future visit scheduled: Yes  Notes to clinic:  Pharmacy requests 90 day supply and diagnosis code.    Requested Prescriptions  Pending Prescriptions Disp Refills   hydrOXYzine (ATARAX) 10 MG tablet [Pharmacy Med Name: HYDROXYZINE HCL 10 MG TABLET] 180 tablet 2    Sig: Take 1 tablet (10 mg total) by mouth 2 (two) times daily as needed for itching or anxiety.     Ear, Nose, and Throat:  Antihistamines 2 Passed - 08/05/2022 11:31 AM      Passed - Cr in normal range and within 360 days    Creat  Date Value Ref Range Status  06/05/2022 0.70 0.50 - 1.03 mg/dL Final         Passed - Valid encounter within last 12 months    Recent Outpatient Visits           3 weeks ago Annual physical exam   [facility] Margarita Mail, DO   1 month ago Primary hypertension   [facility] Margarita Mail, DO   2 months ago Hypertension, unspecified type   [facility] Margarita Mail, DO   5 years ago Anxiety   [facility] Ellyn Hack, MD   5 years ago Otalgia of left ear   [facility] Lada, Janit Bern, MD       Future Appointments             In 1 month Margarita Mail, DO [facility], [facility]

## 2022-08-15 ENCOUNTER — Other Ambulatory Visit: Payer: Self-pay | Admitting: *Deleted

## 2022-08-15 DIAGNOSIS — Z122 Encounter for screening for malignant neoplasm of respiratory organs: Secondary | ICD-10-CM

## 2022-08-15 DIAGNOSIS — Z87891 Personal history of nicotine dependence: Secondary | ICD-10-CM

## 2022-09-01 ENCOUNTER — Other Ambulatory Visit: Payer: Self-pay | Admitting: Internal Medicine

## 2022-09-01 DIAGNOSIS — I10 Essential (primary) hypertension: Secondary | ICD-10-CM

## 2022-09-04 NOTE — Telephone Encounter (Signed)
Requested Prescriptions  Pending Prescriptions Disp Refills   lisinopril (ZESTRIL) 20 MG tablet [Pharmacy Med Name: LISINOPRIL 20 MG TABLET] 90 tablet 1    Sig: TAKE 1 TABLET BY MOUTH EVERY DAY     Cardiovascular:  ACE Inhibitors Passed - 09/01/2022  8:57 AM      Passed - Cr in normal range and within 180 days    Creat  Date Value Ref Range Status  06/05/2022 0.70 0.50 - 1.03 mg/dL Final         Passed - K in normal range and within 180 days    Potassium  Date Value Ref Range Status  06/05/2022 4.3 3.5 - 5.3 mmol/L Final         Passed - Patient is not pregnant      Passed - Last BP in normal range    BP Readings from Last 1 Encounters:  07/12/22 136/82         Passed - Valid encounter within last 6 months    Recent Outpatient Visits           1 month ago Annual physical exam   [facility] Margarita Mail, DO   2 months ago Primary hypertension   [facility] Margarita Mail, DO   3 months ago Hypertension, unspecified type   [facility] Margarita Mail, DO   5 years ago Anxiety   [facility] Ellyn Hack, MD   5 years ago Otalgia of left ear   [facility] Lada, Janit Bern, MD       Future Appointments             In 1 week Margarita Mail, DO [facility]             hydrochlorothiazide (HYDRODIURIL) 25 MG tablet [Pharmacy Med Name: HYDROCHLOROTHIAZIDE 25 MG TAB] 90 tablet 1    Sig: TAKE 1 TABLET (25 MG TOTAL) BY MOUTH DAILY.     Cardiovascular: Diuretics - Thiazide Passed - 09/01/2022  8:57 AM      Passed - Cr in normal range and within 180 days    Creat  Date Value Ref Range Status  06/05/2022 0.70 0.50 - 1.03 mg/dL Final         Passed - K in normal range and within 180 days    Potassium  Date Value Ref Range Status  06/05/2022 4.3 3.5 - 5.3 mmol/L Final          Passed - Na in normal range and within 180 days    Sodium  Date Value Ref Range Status  06/05/2022 139 135 - 146 mmol/L Final  10/05/2016 138 134 - 144 mmol/L Final         Passed - Last BP in normal range    BP Readings from Last 1 Encounters:  07/12/22 136/82         Passed - Valid encounter within last 6 months    Recent Outpatient Visits           1 month ago Annual physical exam   [facility] Margarita Mail, DO   2 months ago Primary hypertension   [facility] Margarita Mail, DO   3 months ago Hypertension, unspecified type   [facility] Margarita Mail, DO   5 years ago Anxiety   [facility]  Ellyn Hack, MD   5 years ago Otalgia of left ear   [facility] Lada, Janit Bern, MD       Future Appointments             In 1 week Margarita Mail, DO [facility], [facility]

## 2022-09-07 ENCOUNTER — Other Ambulatory Visit: Payer: Self-pay | Admitting: Internal Medicine

## 2022-09-07 DIAGNOSIS — F329 Major depressive disorder, single episode, unspecified: Secondary | ICD-10-CM

## 2022-09-10 NOTE — Telephone Encounter (Signed)
Requested Prescriptions  Pending Prescriptions Disp Refills   escitalopram (LEXAPRO) 10 MG tablet [Pharmacy Med Name: ESCITALOPRAM 10 MG TABLET] 30 tablet 2    Sig: TAKE 1 TABLET BY MOUTH EVERY DAY     Psychiatry:  Antidepressants - SSRI Passed - 09/07/2022  7:24 PM      Passed - Valid encounter within last 6 months    Recent Outpatient Visits           2 months ago Annual physical exam   [facility] Margarita Mail, DO   3 months ago Primary hypertension   [facility] Margarita Mail, DO   3 months ago Hypertension, unspecified type   [facility] Margarita Mail, DO   5 years ago Anxiety   [facility] Ellyn Hack, MD   5 years ago Otalgia of left ear   [facility] Lada, Janit Bern, MD       Future Appointments             In 4 days Margarita Mail, DO [facility], [facility]

## 2022-09-13 ENCOUNTER — Ambulatory Visit (INDEPENDENT_AMBULATORY_CARE_PROVIDER_SITE_OTHER): Payer: 59 | Admitting: Primary Care

## 2022-09-13 ENCOUNTER — Encounter: Payer: Self-pay | Admitting: Primary Care

## 2022-09-13 ENCOUNTER — Ambulatory Visit
Admission: RE | Admit: 2022-09-13 | Discharge: 2022-09-13 | Disposition: A | Discharge status: Home / Self Care | Payer: 59 | Source: Ambulatory Visit | Attending: Acute Care | Admitting: Acute Care

## 2022-09-13 DIAGNOSIS — Z87891 Personal history of nicotine dependence: Secondary | ICD-10-CM | POA: Diagnosis not present

## 2022-09-13 DIAGNOSIS — Z122 Encounter for screening for malignant neoplasm of respiratory organs: Secondary | ICD-10-CM

## 2022-09-13 DIAGNOSIS — J439 Emphysema, unspecified: Secondary | ICD-10-CM | POA: Diagnosis not present

## 2022-09-13 DIAGNOSIS — I700 Atherosclerosis of aorta: Secondary | ICD-10-CM | POA: Diagnosis not present

## 2022-09-13 NOTE — Patient Instructions (Signed)
Thank you for participating in the [facility]. It was our pleasure to meet you today. We will call you with the results of your scan within the next few days. Your scan will be assigned a Lung RADS category score by the physicians reading the scans.  This Lung RADS score determines follow up scanning.  See below for description of categories, and follow up screening recommendations. We will be in touch to schedule your follow up screening annually or based on recommendations of our providers. We will fax a copy of your scan results to your Primary Care Physician, or the physician who referred you to the program, to ensure they have the results. Please call the office if you have any questions or concerns regarding your scanning experience or results.  Our office number is [phone]. Please speak with Denise Phelps, RN. , or  Denise Buckner RN, They are  our Lung Cancer Screening RN.'s If They are unavailable when you call, Please leave a message on the voice mail. We will return your call at our earliest convenience.This voice mail is monitored several times a day.  Remember, if your scan is normal, we will scan you annually as long as you continue to meet the criteria for the program. (Age 50-80, Current smoker or smoker who has quit within the last 15 years). If you are a smoker, remember, quitting is the single most powerful action that you can take to decrease your risk of lung cancer and other pulmonary, breathing related problems. We know quitting is hard, and we are here to help.  Please let us know if there is anything we can do to help you meet your goal of quitting. If you are a former smoker, congratulations. We are proud of you! Remain smoke free! Remember you can refer friends or family members through the number above.  We will screen them to make sure they meet criteria for the program. Thank you for helping us take better care of you by  participating in Lung Screening.  You can receive free nicotine replacement therapy ( patches, gum or mints) by calling 1-800-QUIT NOW. Please call so we can get you on the path to becoming  a non-smoker. I know it is hard, but you can do this!  Lung RADS Categories:  Lung RADS 1: no nodules or definitely non-concerning nodules.  Recommendation is for a repeat annual scan in 12 months.  Lung RADS 2:  nodules that are non-concerning in appearance and behavior with a very low likelihood of becoming an active cancer. Recommendation is for a repeat annual scan in 12 months.  Lung RADS 3: nodules that are probably non-concerning , includes nodules with a low likelihood of becoming an active cancer.  Recommendation is for a 6-month repeat screening scan. Often noted after an upper respiratory illness. We will be in touch to make sure you have no questions, and to schedule your 6-month scan.  Lung RADS 4 A: nodules with concerning findings, recommendation is most often for a follow up scan in 3 months or additional testing based on our provider's assessment of the scan. We will be in touch to make sure you have no questions and to schedule the recommended 3 month follow up scan.  Lung RADS 4 B:  indicates findings that are concerning. We will be in touch with you to schedule additional diagnostic testing based on our provider's  assessment of the scan.  Other options for assistance in smoking cessation (   As covered by your insurance benefits)  Hypnosis for smoking cessation  [employer] [phone]  Acupuncture for smoking cessation  [facility] [phone]   

## 2022-09-13 NOTE — Progress Notes (Signed)
Virtual Visit via Telephone Note  I connected with Morgan Gordon on 09/13/22 at  9:00 AM EDT by telephone and verified that I am speaking with the correct person using two identifiers.  Location: Patient: Home Provider: Office    I discussed the limitations, risks, security and privacy concerns of performing an evaluation and management service by telephone and the availability of in person appointments. I also discussed with the patient that there may be a patient responsible charge related to this service. The patient expressed understanding and agreed to proceed.  Shared Decision Making Visit Lung Cancer Screening Program [phone])   Eligibility: Age 52 y.o. Pack Years Smoking History Calculation 26 (# packs/per year x # years smoked) Recent History of coughing up blood  no Unexplained weight loss? no ( >Than 15 pounds within the last 6 months ) Prior History Lung / other cancer no (Diagnosis within the last 5 years already requiring surveillance chest CT Scans). Smoking Status Former Smoker Former Smokers: Years since quit: 9 years  Quit Date: 2015  Visit Components: Discussion included one or more decision making aids. yes Discussion included risk/benefits of screening. yes Discussion included potential follow up diagnostic testing for abnormal scans. yes Discussion included meaning and risk of over diagnosis. yes Discussion included meaning and risk of False Positives. yes Discussion included meaning of total radiation exposure. yes  Counseling Included: Importance of adherence to annual lung cancer LDCT screening. yes Impact of comorbidities on ability to participate in the program. yes Ability and willingness to under diagnostic treatment. yes  Smoking Cessation Counseling: Current Smokers:  Discussed importance of smoking cessation. yes Information about tobacco cessation classes and interventions provided to patient. yes Patient provided with "ticket" for LDCT  Scan. NA Symptomatic Patient. no  Counseling(Intermediate counseling: > three minutes) 99406 Diagnosis Code: Tobacco Use Z72.0 Asymptomatic Patient yes  Counseling (Intermediate counseling: > three minutes counseling) [id number] Former Smokers:  Discussed the importance of maintaining cigarette abstinence. yes Diagnosis Code: Personal History of Nicotine Dependence. W29.562 Information about tobacco cessation classes and interventions provided to patient. Yes Patient provided with "ticket" for LDCT Scan. NA Written Order for Lung Cancer Screening with LDCT placed in Epic. Yes (CT Chest Lung Cancer Screening Low Dose W/O CM) [id number] Z12.2-Screening of respiratory organs Z87.891-Personal history of nicotine dependence  I have spent 25 minutes of face to face/ virtual visit time with Morgan Gordon discussing the risks and benefits of lung cancer screening. We viewed / discussed a power point together that explained in detail the above noted topics. We paused at intervals to allow for questions to be asked and answered to ensure understanding.We discussed that the single most powerful action that she can take to decrease her risk of developing lung cancer is to quit smoking. We discussed whether or not she is ready to commit to setting a quit date. We discussed options for tools to aid in quitting smoking including nicotine replacement therapy, non-nicotine medications, support groups, Quit Smart classes, and behavior modification. We discussed that often times setting smaller, more achievable goals, such as eliminating 1 cigarette a day for a week and then 2 cigarettes a day for a week can be helpful in slowly decreasing the number of cigarettes smoked. This allows for a sense of accomplishment as well as providing a clinical benefit. I provided  her  with smoking cessation  information  with contact information for community resources, classes, free nicotine replacement therapy, and access to mobile apps,  text messaging, and on-line  smoking cessation help. I have also provided  her  the office contact information in the event she needs to contact me, or the screening staff. We discussed the time and location of the scan, and that either Abigail Miyamoto RN, Karlton Lemon, RN  or I will call / send a letter with the results within 24-72 hours of receiving them. The patient verbalized understanding of all of  the above and had no further questions upon leaving the office. They have my contact information in the event they have any further questions.  I spent 3-5 minutes counseling on smoking cessation and the health risks of continued tobacco abuse.  I explained to the patient that there has been a high incidence of coronary artery disease noted on these exams. I explained that this is a non-gated exam therefore degree or severity cannot be determined. This patient is on statin therapy. I have asked the patient to follow-up with their PCP regarding any incidental finding of coronary artery disease and management with diet or medication as their PCP  feels is clinically indicated. The patient verbalized understanding of the above and had no further questions upon completion of the visit.   Morgan Bayley, NP

## 2022-09-13 NOTE — Progress Notes (Signed)
Established Patient Office Visit  Subjective    Patient ID: Morgan Gordon, female    DOB: 12-23-1970  Age: 52 y.o. MRN: [id number]  CC:  Chief Complaint  Patient presents with   Follow-up    HPI Morgan Gordon presents to follow up on chronic medical conditions.  Hypertension: -Medications: HCTZ 25 mg, Lisinopril 20 mg  -Checking BP at home (average): 130-140/70-80 -Denies any SOB, CP, vision changes, LE edema or symptoms of hypotension  HLD: -Medications: Lipitor 10 mg -Patient is compliant with the medication and tolerating well  -Last lipid panel: Lipid Panel     Component Value Date/Time   CHOL 247 (H) 06/05/2022 1001   CHOL 152 10/05/2016 0825   TRIG 148 06/05/2022 1001   HDL 43 (L) 06/05/2022 1001   HDL 53 10/05/2016 0825   CHOLHDL 5.7 (H) 06/05/2022 1001   LDLCALC 175 (H) 06/05/2022 1001   LABVLDL 10 10/05/2016 0825   The 10-year ASCVD risk score (Arnett DK, et al., 2019) is: 2.6%   Values used to calculate the score:     Age: 54 years     Sex: Female     Is Non-Hispanic African American: No     Diabetic: No     Tobacco smoker: No     Systolic Blood Pressure: 116 mmHg     Is BP treated: Yes     HDL Cholesterol: 43 mg/dL     Total Cholesterol: 247 mg/dL  Pre-Diabetes: -W7P 7/10 6.3% -Not currently on medications   GERD: -Currently on Pepcid 20 mg daily -Controls symptoms well  -Cannot take NSAID's because they exacerbated symptoms   MDD: -Currently on Lexapro 10 mg, also has Hydroxyzine 10 mg BID - this combination is working really well for her and she reports no side effects.      09/14/2022    9:48 AM 07/12/2022   10:34 AM 06/12/2022    1:18 PM 06/01/2022    1:06 PM 02/20/2017    2:39 PM  Depression screen PHQ 2/9  Decreased Interest 0 0 0 2 0  Down, Depressed, Hopeless 0 0 0 2 0  PHQ - 2 Score 0 0 0 4 0  Altered sleeping 0 0  3   Tired, decreased energy 0 0  2   Change in appetite 0 0  1   Feeling bad or failure about yourself  0  0  3   Trouble concentrating 0 0  0   Moving slowly or fidgety/restless 0 0  0   Suicidal thoughts 0 0  1   PHQ-9 Score 0 0  14   Difficult doing work/chores Not difficult at all Not difficult at all       Health Maintenance: -Blood work UTD -Mammogram 3/24, Birads-1 -Colon cancer screening: Cologuard 2/24 negative  -Pap negative 4/24 -Lung cancer screening done 09/13/22, waiting for results    Outpatient Encounter Medications as of 09/14/2022  Medication Sig   acetaminophen (TYLENOL) 500 MG tablet Take 1,000 mg by mouth in the morning and at bedtime.   Ascorbic Acid (VITAMIN C) 1000 MG tablet Take 1 tablet daily by mouth.   atorvastatin (LIPITOR) 10 MG tablet Take 1 tablet (10 mg total) by mouth daily.   escitalopram (LEXAPRO) 10 MG tablet TAKE 1 TABLET BY MOUTH EVERY DAY   hydrochlorothiazide (HYDRODIURIL) 25 MG tablet TAKE 1 TABLET (25 MG TOTAL) BY MOUTH DAILY.   hydrOXYzine (ATARAX) 10 MG tablet TAKE 1 TABLET (10 MG TOTAL) BY MOUTH  2 (TWO) TIMES DAILY AS NEEDED FOR ITCHING OR ANXIETY.   lisinopril (ZESTRIL) 20 MG tablet TAKE 1 TABLET BY MOUTH EVERY DAY   omeprazole (PRILOSEC) 20 MG capsule Take 20 mg by mouth daily.   OVER THE COUNTER MEDICATION Burburine 2000mg  daily   Zinc 10 MG LOZG Use as directed in the mouth or throat.   No facility-administered encounter medications on file as of 09/14/2022.    Past Medical History:  Diagnosis Date   Allergy    Year-round allergies and sinus infections.   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 Sees Chiropractor in [place].    GERD (gastroesophageal reflux disease)    History of genital warts    Had D and C   Hyperlipidemia    Hypertension     Past Surgical History:  Procedure Laterality Date   ESOPHAGOGASTRODUODENOSCOPY (EGD) WITH PROPOFOL N/A 03/08/2017   Procedure:  ESOPHAGOGASTRODUODENOSCOPY (EGD) WITH PROPOFOL;  Surgeon: Pasty Spillers, MD;  Location: [facility] END[facility]COPY;  Service: Endoscopy;  Laterality: N/A;   LESION EXCISION     genital warts   SEPTOPLASTY N/A 03/14/2017   Procedure: SEPTOPLASTY;  Surgeon: Vernie Murders, MD;  Location: [facility];  Service: ENT;  Laterality: N/A;   TUBAL LIGATION  1997   TURBINATE RESECTION Bilateral 03/14/2017   Procedure: BILATERAL INFERIOR TURBINATE OUT-FRACTURE;  Surgeon: Vernie Murders, MD;  Location: [facility];  Service: ENT;  Laterality: Bilateral;    Family History  Problem Relation Age of Onset   Thyroid disease Mother    Diabetes Mother    Cancer Father        lymph node cancer   Diabetes Father    Colon cancer Maternal Grandmother    Diabetes Brother    Thyroid disease Brother     Social History   Socioeconomic History   Marital status: Widowed    Spouse name: Not on file   Number of children: Not on file   Years of education: Not on file   Highest education level: Associate degree: occupational, Scientist, product/process development, or vocational program  Occupational History   Not on file  Tobacco Use   Smoking status: Former    Packs/day: .5    Types: Cigarettes    Quit date: 06/24/2013    Years since quitting: 9.2   Smokeless tobacco: Never  Vaping Use   Vaping Use: Never used  Substance and Sexual Activity   Alcohol use: No   Drug use: No   Sexual activity: Not Currently  Other Topics Concern   Not on file  Social History Narrative   Not on file   Social Determinants of Health   Financial Resource Strain: Low Risk  (09/10/2022)   Overall Financial Resource Strain (CARDIA)    Difficulty of Paying Living Expenses: Not hard at all  Food Insecurity: No Food Insecurity (09/10/2022)   Hunger Vital Sign    Worried About Running Out of Food in the Last Year: Never true    Ran Out of Food in the Last Year: Never true  Transportation Needs: No Transportation Needs (09/10/2022)   PRAPARE -  Administrator, Civil Service (Medical): No    Lack of Transportation (Non-Medical): No  Physical Activity: Insufficiently Active (09/10/2022)   Exercise Vital Sign  Days of Exercise per Week: 3 days    Minutes of Exercise per Session: 30 min  Stress: No Stress Concern Present (09/10/2022)   [employer] of Occupational Health - Occupational Stress Questionnaire    Feeling of Stress : Not at all  Social Connections: Moderately Isolated (09/10/2022)   Social Connection and Isolation Panel [NHANES]    Frequency of Communication with Friends and Family: Three times a week    Frequency of Social Gatherings with Friends and Family: Three times a week    Attends Religious Services: More than 4 times per year    Active Member of Clubs or Organizations: No    Attends Banker Meetings: Never    Marital Status: Widowed  Intimate Partner Violence: Not At Risk (07/12/2022)   Humiliation, Afraid, Rape, and Kick questionnaire    Fear of Current or Ex-Partner: No    Emotionally Abused: No    Physically Abused: No    Sexually Abused: No    Review of Systems  Constitutional:  Negative for chills and fever.  Eyes:  Negative for blurred vision.  Respiratory:  Negative for shortness of breath.   Cardiovascular:  Negative for chest pain.  Neurological:  Negative for dizziness.       Objective    BP 116/76   Pulse 74   Temp 98 F (36.7 C)   Resp 18   Ht 5\' 4"  (1.626 m)   Wt 216 lb 8 oz (98.2 kg)   LMP 12/23/2016 (Approximate) Comment: period every 3 months due to hormone therapy - preg test neg  SpO2 98%   BMI 37.16 kg/m   Physical Exam Constitutional:      Appearance: Normal appearance.  HENT:     Head: Normocephalic and atraumatic.  Eyes:     Conjunctiva/sclera: Conjunctivae normal.  Cardiovascular:     Rate and Rhythm: Normal rate and regular rhythm.  Pulmonary:     Effort: Pulmonary effort is normal.     Breath sounds: Normal breath sounds.   Musculoskeletal:     Right lower leg: No edema.     Left lower leg: No edema.  Skin:    General: Skin is warm and dry.  Neurological:     General: No focal deficit present.     Mental Status: She is alert. Mental status is at baseline.  Psychiatric:        Mood and Affect: Mood normal.        Behavior: Behavior normal.         Assessment & Plan:   1. Primary hypertension: Blood pressure controlled, continue Lisinopril 20 mg, HCTZ 25 mg.   2. Mixed hyperlipidemia: Stable, continue Lipitor 10 mg.  3. Prediabetes: A1c improved today to 5.9%, no on any medications. Continue to monitor.    - POCT HgB A1C  4. Current episode of major depressive disorder without prior episode, unspecified depression episode severity: Stable, doing well on Lexapro 10 mg and Hydroxyzine 10 mg BID, refills today.   - escitalopram (LEXAPRO) 10 MG tablet; Take 1 tablet (10 mg total) by mouth daily.  Dispense: 90 tablet; Refill: 1  5. Gastroesophageal reflux disease, unspecified whether esophagitis present: Symptoms stable on Prilosec 20 mg.   Return in about 6 months (around 03/16/2023).   Margarita Mail, DO

## 2022-09-14 ENCOUNTER — Ambulatory Visit: Payer: 59 | Admitting: Internal Medicine

## 2022-09-14 ENCOUNTER — Encounter: Payer: Self-pay | Admitting: Internal Medicine

## 2022-09-14 VITALS — BP 116/76 | HR 74 | Temp 98.0°F | Resp 18 | Ht 64.0 in | Wt 216.5 lb

## 2022-09-14 DIAGNOSIS — E782 Mixed hyperlipidemia: Secondary | ICD-10-CM

## 2022-09-14 DIAGNOSIS — R7303 Prediabetes: Secondary | ICD-10-CM | POA: Diagnosis not present

## 2022-09-14 DIAGNOSIS — I10 Essential (primary) hypertension: Secondary | ICD-10-CM | POA: Diagnosis not present

## 2022-09-14 DIAGNOSIS — K219 Gastro-esophageal reflux disease without esophagitis: Secondary | ICD-10-CM | POA: Diagnosis not present

## 2022-09-14 DIAGNOSIS — F329 Major depressive disorder, single episode, unspecified: Secondary | ICD-10-CM

## 2022-09-14 LAB — POCT GLYCOSYLATED HEMOGLOBIN (HGB A1C): Hemoglobin A1C: 5.9 % — AB (ref 4.0–5.6)

## 2022-09-14 MED ORDER — ESCITALOPRAM OXALATE 10 MG PO TABS: 10.0000 mg | ORAL_TABLET | Freq: Every day | ORAL | 1 refills | Status: DC

## 2022-09-20 ENCOUNTER — Other Ambulatory Visit: Payer: Self-pay | Admitting: Acute Care

## 2022-09-20 DIAGNOSIS — Z87891 Personal history of nicotine dependence: Secondary | ICD-10-CM

## 2022-09-20 DIAGNOSIS — Z122 Encounter for screening for malignant neoplasm of respiratory organs: Secondary | ICD-10-CM

## 2022-10-30 DIAGNOSIS — F411 Generalized anxiety disorder: Secondary | ICD-10-CM | POA: Diagnosis not present

## 2022-10-30 DIAGNOSIS — M1990 Unspecified osteoarthritis, unspecified site: Secondary | ICD-10-CM | POA: Diagnosis not present

## 2022-10-30 DIAGNOSIS — Z833 Family history of diabetes mellitus: Secondary | ICD-10-CM | POA: Diagnosis not present

## 2022-10-30 DIAGNOSIS — Z885 Allergy status to narcotic agent status: Secondary | ICD-10-CM | POA: Diagnosis not present

## 2022-10-30 DIAGNOSIS — Z809 Family history of malignant neoplasm, unspecified: Secondary | ICD-10-CM | POA: Diagnosis not present

## 2022-10-30 DIAGNOSIS — I129 Hypertensive chronic kidney disease with stage 1 through stage 4 chronic kidney disease, or unspecified chronic kidney disease: Secondary | ICD-10-CM | POA: Diagnosis not present

## 2022-10-30 DIAGNOSIS — K219 Gastro-esophageal reflux disease without esophagitis: Secondary | ICD-10-CM | POA: Diagnosis not present

## 2022-10-30 DIAGNOSIS — E785 Hyperlipidemia, unspecified: Secondary | ICD-10-CM | POA: Diagnosis not present

## 2022-10-30 DIAGNOSIS — Z87891 Personal history of nicotine dependence: Secondary | ICD-10-CM | POA: Diagnosis not present

## 2022-10-30 DIAGNOSIS — Z6838 Body mass index (BMI) 38.0-38.9, adult: Secondary | ICD-10-CM | POA: Diagnosis not present

## 2022-10-30 DIAGNOSIS — Z8249 Family history of ischemic heart disease and other diseases of the circulatory system: Secondary | ICD-10-CM | POA: Diagnosis not present

## 2022-11-21 ENCOUNTER — Telehealth: Payer: Self-pay | Admitting: Internal Medicine

## 2022-11-21 NOTE — Telephone Encounter (Signed)
Copied from CRM [id number]. Topic: General - Other >> Nov 21, 2022  2:40 PM Ja-Kwan M wrote: Reason for CRM: Pt stated that she had a home health visit and the provider that came out to her home informed her that she is eligible for incentives through her insurance. Pt requests that Dr. Caralee Ates provide her insurance with the information needed for her to receive the incentives. Asked pt if she had the details of what is needed and pt stated Dr. Caralee Ates would have the information. Cb# [phone]

## 2022-11-21 NOTE — Telephone Encounter (Signed)
Patient notified she will have to ins. call to get more info?

## 2022-11-27 ENCOUNTER — Other Ambulatory Visit: Payer: Self-pay | Admitting: Internal Medicine

## 2022-11-27 DIAGNOSIS — E782 Mixed hyperlipidemia: Secondary | ICD-10-CM

## 2022-11-28 NOTE — Telephone Encounter (Signed)
Requested Prescriptions  Pending Prescriptions Disp Refills   atorvastatin (LIPITOR) 10 MG tablet [Pharmacy Med Name: ATORVASTATIN 10 MG TABLET] 30 tablet 5    Sig: TAKE 1 TABLET BY MOUTH EVERY DAY     Cardiovascular:  Antilipid - Statins Failed - 11/27/2022  1:27 AM      Failed - Lipid Panel in normal range within the last 12 months    Cholesterol, Total  Date Value Ref Range Status  10/05/2016 152 100 - 199 mg/dL Final   Cholesterol  Date Value Ref Range Status  06/05/2022 247 (H) <200 mg/dL Final   LDL Cholesterol (Calc)  Date Value Ref Range Status  06/05/2022 175 (H) mg/dL (calc) Final    Comment:    Reference range: <100 . Desirable range <100 mg/dL for primary prevention;   <70 mg/dL for patients with CHD or diabetic patients  with > or = 2 CHD risk factors. Marland Kitchen LDL-C is now calculated using the Martin-Hopkins  calculation, which is a validated novel method providing  better accuracy than the Friedewald equation in the  estimation of LDL-C.  Horald Pollen et al. Lenox Ahr. 8295;621(30): 2061-2068  (http://education.[facility].com/faq/FAQ164)    HDL  Date Value Ref Range Status  06/05/2022 43 (L) > OR = 50 mg/dL Final  86/57/8469 53 >62 mg/dL Final   Triglycerides  Date Value Ref Range Status  06/05/2022 148 <150 mg/dL Final         Passed - Patient is not pregnant      Passed - Valid encounter within last 12 months    Recent Outpatient Visits           2 months ago Primary hypertension   [facility] Margarita Mail, DO   4 months ago Annual physical exam   [facility] Margarita Mail, DO   5 months ago Primary hypertension   [facility] Margarita Mail, DO   6 months ago Hypertension, unspecified type   [facility] Margarita Mail, DO   5 years ago Anxiety   [facility] Ellyn Hack, MD       Future  Appointments             In 3 months Margarita Mail, DO [facility]

## 2023-02-26 ENCOUNTER — Other Ambulatory Visit: Payer: Self-pay | Admitting: Internal Medicine

## 2023-02-26 DIAGNOSIS — I10 Essential (primary) hypertension: Secondary | ICD-10-CM

## 2023-02-26 DIAGNOSIS — F329 Major depressive disorder, single episode, unspecified: Secondary | ICD-10-CM

## 2023-02-27 NOTE — Telephone Encounter (Signed)
Requested Prescriptions  Pending Prescriptions Disp Refills   lisinopril (ZESTRIL) 20 MG tablet [Pharmacy Med Name: LISINOPRIL 20 MG TABLET] 90 tablet 0    Sig: TAKE 1 TABLET BY MOUTH EVERY DAY     Cardiovascular:  ACE Inhibitors Failed - 02/26/2023  1:36 AM      Failed - Cr in normal range and within 180 days    Creat  Date Value Ref Range Status  06/05/2022 0.70 0.50 - 1.03 mg/dL Final         Failed - K in normal range and within 180 days    Potassium  Date Value Ref Range Status  06/05/2022 4.3 3.5 - 5.3 mmol/L Final         Passed - Patient is not pregnant      Passed - Last BP in normal range    BP Readings from Last 1 Encounters:  09/14/22 116/76         Passed - Valid encounter within last 6 months    Recent Outpatient Visits           5 months ago Primary hypertension   [facility] Margarita Mail, DO   7 months ago Annual physical exam   [facility] Margarita Mail, DO   8 months ago Primary hypertension   [facility] Margarita Mail, DO   9 months ago Hypertension, unspecified type   [facility] Margarita Mail, DO   6 years ago Anxiety   [facility] Ellyn Hack, MD       Future Appointments             In 2 weeks Margarita Mail, DO  [facility]             hydrochlorothiazide (HYDRODIURIL) 25 MG tablet [Pharmacy Med Name: HYDROCHLOROTHIAZIDE 25 MG TAB] 90 tablet 0    Sig: TAKE 1 TABLET (25 MG TOTAL) BY MOUTH DAILY.     Cardiovascular: Diuretics - Thiazide Failed - 02/26/2023  1:36 AM      Failed - Cr in normal range and within 180 days    Creat  Date Value Ref Range Status  06/05/2022 0.70 0.50 - 1.03 mg/dL Final         Failed - K in normal range and within 180 days    Potassium  Date Value Ref Range Status  06/05/2022 4.3 3.5 - 5.3 mmol/L Final          Failed - Na in normal range and within 180 days    Sodium  Date Value Ref Range Status  06/05/2022 139 135 - 146 mmol/L Final  10/05/2016 138 134 - 144 mmol/L Final         Passed - Last BP in normal range    BP Readings from Last 1 Encounters:  09/14/22 116/76         Passed - Valid encounter within last 6 months    Recent Outpatient Visits           5 months ago Primary hypertension   [facility] Margarita Mail, DO   7 months ago Annual physical exam   [facility] Margarita Mail, DO   8 months ago Primary hypertension   [facility] Margarita Mail, DO   9 months ago Hypertension, unspecified type   [facility] Margarita Mail,  DO   6 years ago Anxiety   [facility] Ellyn Hack, MD       Future Appointments             In 2 weeks Margarita Mail, DO [facility]             escitalopram (LEXAPRO) 10 MG tablet [Pharmacy Med Name: ESCITALOPRAM 10 MG TABLET] 90 tablet 0    Sig: TAKE 1 TABLET BY MOUTH EVERY DAY     Psychiatry:  Antidepressants - SSRI Passed - 02/26/2023  1:36 AM      Passed - Valid encounter within last 6 months    Recent Outpatient Visits           5 months ago Primary hypertension   [facility] Margarita Mail, DO   7 months ago Annual physical exam   [facility] Margarita Mail, DO   8 months ago Primary hypertension   [facility] Margarita Mail, DO   9 months ago Hypertension, unspecified type   [facility] Margarita Mail, DO   6 years ago Anxiety   [facility] Ellyn Hack, MD       Future Appointments             In 2 weeks Margarita Mail, DO [facility]

## 2023-03-18 NOTE — Progress Notes (Addendum)
Established Patient Office Visit  Subjective    Patient ID: Morgan Gordon, female    DOB: May 24, 1970  Age: 52 y.o. MRN: [id number]  CC:  Chief Complaint  Patient presents with   Medical Management of Chronic Issues    3 month recheck    HPI Morgan Gordon presents to follow up on chronic medical conditions and would like to discuss weight loss medication.  She is struggled with weight her entire life.  She has been working on decreasing calories and sugar and carbs in the diet, initially lost 9 pounds but then very quickly gained it back.  She finds this very frustrating and really wants to lose weight to help with overall health and joint pain.  Hypertension: -Medications: HCTZ 25 mg, Lisinopril 20 mg  -Checking BP at home (average): 130-140/70-80 -Denies any SOB, CP, vision changes, LE edema or symptoms of hypotension  HLD: -Medications: Lipitor 10 mg, new since last labs -Patient is compliant with the medication and tolerating well  -Last lipid panel: Lipid Panel     Component Value Date/Time   CHOL 247 (H) 06/05/2022 1001   CHOL 152 10/05/2016 0825   TRIG 148 06/05/2022 1001   HDL 43 (L) 06/05/2022 1001   HDL 53 10/05/2016 0825   CHOLHDL 5.7 (H) 06/05/2022 1001   LDLCALC 175 (H) 06/05/2022 1001   LABVLDL 10 10/05/2016 0825   The 10-year ASCVD risk score (Arnett DK, et al., 2019) is: 3.2%   Values used to calculate the score:     Age: 11 years     Sex: Female     Is Non-Hispanic African American: No     Diabetic: No     Tobacco smoker: No     Systolic Blood Pressure: 124 mmHg     Is BP treated: Yes     HDL Cholesterol: 43 mg/dL     Total Cholesterol: 247 mg/dL  Pre-Diabetes: -N8G 9/56 5.9% -Not currently on medications   GERD: -Currently on Pepcid 20 mg, using as needed. Taking twice a week  -Controls symptoms well  -Cannot take NSAID's because they exacerbated symptoms   MDD: -Currently on Lexapro 10 mg, also has Hydroxyzine 10 mg BID - this  combination is working really well for her and she reports no side effects.      03/19/2023    9:56 AM 09/14/2022    9:48 AM 07/12/2022   10:34 AM 06/12/2022    1:18 PM 06/01/2022    1:06 PM  Depression screen PHQ 2/9  Decreased Interest 0 0 0 0 2  Down, Depressed, Hopeless 0 0 0 0 2  PHQ - 2 Score 0 0 0 0 4  Altered sleeping  0 0  3  Tired, decreased energy  0 0  2  Change in appetite  0 0  1  Feeling bad or failure about yourself   0 0  3  Trouble concentrating  0 0  0  Moving slowly or fidgety/restless  0 0  0  Suicidal thoughts  0 0  1  PHQ-9 Score  0 0  14  Difficult doing work/chores  Not difficult at all Not difficult at all      Health Maintenance: -Blood work due  -Mammogram 3/24, Birads-1 -Colon cancer screening: Cologuard 2/24 negative  -Pap negative 4/24 -Lung cancer screening done 09/13/22, waiting for results    Outpatient Encounter Medications as of 03/19/2023  Medication Sig   acetaminophen (TYLENOL) 500 MG tablet Take 1,000  mg by mouth in the morning and at bedtime.   Ascorbic Acid (VITAMIN C) 1000 MG tablet Take 1 tablet daily by mouth.   atorvastatin (LIPITOR) 10 MG tablet TAKE 1 TABLET BY MOUTH EVERY DAY   escitalopram (LEXAPRO) 10 MG tablet TAKE 1 TABLET BY MOUTH EVERY DAY   hydrochlorothiazide (HYDRODIURIL) 25 MG tablet TAKE 1 TABLET (25 MG TOTAL) BY MOUTH DAILY.   hydrOXYzine (ATARAX) 10 MG tablet TAKE 1 TABLET (10 MG TOTAL) BY MOUTH 2 (TWO) TIMES DAILY AS NEEDED FOR ITCHING OR ANXIETY.   lisinopril (ZESTRIL) 20 MG tablet TAKE 1 TABLET BY MOUTH EVERY DAY   omeprazole (PRILOSEC) 20 MG capsule Take 20 mg by mouth daily.   OVER THE COUNTER MEDICATION Burburine 2000mg  daily   tirzepatide (ZEPBOUND) 2.5 MG/0.5ML Pen Inject 2.5 mg into the skin once a week.   Zinc 10 MG LOZG Use as directed in the mouth or throat.   No facility-administered encounter medications on file as of 03/19/2023.    Past Medical History:  Diagnosis Date   Allergy    Year-round  allergies and sinus infections.   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 Sees Chiropractor in [place].    GERD (gastroesophageal reflux disease)    History of genital warts    Had D and C   Hyperlipidemia    Hypertension     Past Surgical History:  Procedure Laterality Date   ESOPHAGOGASTRODUODENOSCOPY (EGD) WITH PROPOFOL N/A 03/08/2017   Procedure: ESOPHAGOGASTRODUODENOSCOPY (EGD) WITH PROPOFOL;  Surgeon: Pasty Spillers, MD;  Location: [facility];  Service: Endoscopy;  Laterality: N/A;   LESION EXCISION     genital warts   SEPTOPLASTY N/A 03/14/2017   Procedure: SEPTOPLASTY;  Surgeon: Vernie Murders, MD;  Location: [facility];  Service: ENT;  Laterality: N/A;   TUBAL LIGATION  1997   TURBINATE RESECTION Bilateral 03/14/2017   Procedure: BILATERAL INFERIOR TURBINATE OUT-FRACTURE;  Surgeon: Vernie Murders, MD;  Location: [facility];  Service: ENT;  Laterality: Bilateral;    Family History  Problem Relation Age of Onset   Thyroid disease Mother    Diabetes Mother    Cancer Father        lymph node cancer   Diabetes Father    Colon cancer Maternal Grandmother    Diabetes Brother    Thyroid disease Brother     Social History   Socioeconomic History   Marital status: Widowed    Spouse name: Not on file   Number of children: Not on file   Years of education: Not on file   Highest education level: Associate degree: occupational, Scientist, product/process development, or vocational program  Occupational History   Not on file  Tobacco Use   Smoking status: Former    Current packs/day: 0.00    Types: Cigarettes    Quit date: 06/24/2013    Years since quitting: 9.7   Smokeless tobacco: Never  Vaping Use   Vaping status: Never Used  Substance and Sexual Activity   Alcohol use: No   Drug use: No   Sexual activity: Not  Currently  Other Topics Concern   Not on file  Social History Narrative   Not on file   Social Determinants of Health   Financial Resource Strain: Low Risk  (03/17/2023)  Overall Financial Resource Strain (CARDIA)    Difficulty of Paying Living Expenses: Not very hard  Food Insecurity: No Food Insecurity (03/17/2023)   Hunger Vital Sign    Worried About Running Out of Food in the Last Year: Never true    Ran Out of Food in the Last Year: Never true  Transportation Needs: No Transportation Needs (03/17/2023)   PRAPARE - Administrator, Civil Service (Medical): No    Lack of Transportation (Non-Medical): No  Physical Activity: Insufficiently Active (03/17/2023)   Exercise Vital Sign    Days of Exercise per Week: 2 days    Minutes of Exercise per Session: 60 min  Stress: No Stress Concern Present (03/17/2023)   [employer] of Occupational Health - Occupational Stress Questionnaire    Feeling of Stress : Not at all  Social Connections: Moderately Integrated (03/17/2023)   Social Connection and Isolation Panel [NHANES]    Frequency of Communication with Friends and Family: Three times a week    Frequency of Social Gatherings with Friends and Family: Twice a week    Attends Religious Services: More than 4 times per year    Active Member of [employer] or Organizations: Yes    Attends Banker Meetings: More than 4 times per year    Marital Status: Widowed  Intimate Partner Violence: Not At Risk (07/12/2022)   Humiliation, Afraid, Rape, and Kick questionnaire    Fear of Current or Ex-Partner: No    Emotionally Abused: No    Physically Abused: No    Sexually Abused: No    Review of Systems  Constitutional:  Negative for chills and fever.  Eyes:  Negative for blurred vision.  Respiratory:  Negative for shortness of breath.   Cardiovascular:  Negative for chest pain.  Neurological:  Negative for dizziness.       Objective    BP 124/82   Pulse 75   Temp  97.7 F (36.5 C) (Oral)   Resp 16   Ht 5\' 4"  (1.626 m)   Wt 221 lb 12.8 oz (100.6 kg)   LMP 12/23/2016 (Approximate) Comment: period every 3 months due to hormone therapy - preg test neg  SpO2 98%   BMI 38.07 kg/m   Physical Exam Constitutional:      Appearance: Normal appearance.  HENT:     Head: Normocephalic and atraumatic.  Eyes:     Conjunctiva/sclera: Conjunctivae normal.  Cardiovascular:     Rate and Rhythm: Normal rate and regular rhythm.  Pulmonary:     Effort: Pulmonary effort is normal.     Breath sounds: Normal breath sounds.  Skin:    General: Skin is warm and dry.  Neurological:     General: No focal deficit present.     Mental Status: She is alert. Mental status is at baseline.  Psychiatric:        Mood and Affect: Mood normal.        Behavior: Behavior normal.         Assessment & Plan:   1. Class 2 severe obesity with serious comorbidity and body mass index (BMI) of 38.0 to 38.9 in adult, unspecified obesity type [facility]): Discussed how GLP medications work and common side effects.  Discussed I will discontinue medication if there are any instances of pancreatitis.  She denies personal or family history of medullary thyroid carcinoma.  Will prescribe Zepbound 2.5 mg weekly to her pharmacy.  Patient will call after using the first pen to schedule  follow-up.  - tirzepatide (ZEPBOUND) 2.5 MG/0.5ML Pen; Inject 2.5 mg into the skin once a week.  Dispense: 2 mL; Refill: 0  2. Primary hypertension: Blood pressure stable here today, no changes made to medications.  Labs due.  - CBC w/Diff/Platelet - COMPLETE METABOLIC PANEL WITH GFR  3. Mixed hyperlipidemia: Labs.  Lipitor 10 mg new since last year, will continue for now but may need to increase the dose.  - Lipid Profile  4. Gastroesophageal reflux disease, unspecified whether esophagitis present: Improved, patient is only taking over-the-counter Pepcid as needed which is about 1-2 times a week.  5.  Prediabetes: Recheck A1c today.  - HgB A1c  6. Family history of Graves' disease: Due to her sister having Graves' disease will check TSH annually.  - TSH  Return for will call to schedule.   Margarita Mail, DO  Addendum 03/22/23: Patient's A1c now 6.8%, making her a new onset diabetic. Will change Zepbound to Mounjaro 2.5 mg weekly to reflect this diagnosis change.

## 2023-03-19 ENCOUNTER — Ambulatory Visit: Payer: 59 | Admitting: Internal Medicine

## 2023-03-19 ENCOUNTER — Other Ambulatory Visit: Payer: Self-pay

## 2023-03-19 ENCOUNTER — Encounter: Payer: Self-pay | Admitting: Internal Medicine

## 2023-03-19 VITALS — BP 124/82 | HR 75 | Temp 97.7°F | Resp 16 | Ht 64.0 in | Wt 221.8 lb

## 2023-03-19 DIAGNOSIS — R7303 Prediabetes: Secondary | ICD-10-CM

## 2023-03-19 DIAGNOSIS — I10 Essential (primary) hypertension: Secondary | ICD-10-CM

## 2023-03-19 DIAGNOSIS — E66812 Obesity, class 2: Secondary | ICD-10-CM

## 2023-03-19 DIAGNOSIS — Z6838 Body mass index (BMI) 38.0-38.9, adult: Secondary | ICD-10-CM

## 2023-03-19 DIAGNOSIS — K219 Gastro-esophageal reflux disease without esophagitis: Secondary | ICD-10-CM | POA: Diagnosis not present

## 2023-03-19 DIAGNOSIS — E782 Mixed hyperlipidemia: Secondary | ICD-10-CM | POA: Diagnosis not present

## 2023-03-19 DIAGNOSIS — Z8349 Family history of other endocrine, nutritional and metabolic diseases: Secondary | ICD-10-CM | POA: Diagnosis not present

## 2023-03-19 MED ORDER — ZEPBOUND 2.5 MG/0.5ML ~~LOC~~ SOAJ: 2.5000 mg | SUBCUTANEOUS | 0 refills | Status: DC

## 2023-03-19 NOTE — Patient Instructions (Addendum)
It was great seeing you today!  Plan discussed at today's visit: -Blood work ordered today, results will be uploaded to MyChart. Please return fasting at least 8-12 hours. Lab is open every weekday 8:30-11:30 and 1:30-3:30.  -Call me after you start weight loss medication to return in 1 month. If not I will see you in 6 months.   Take care and let us know if you have any questions or concerns prior to your next visit.  Dr. Caralee Ates

## 2023-03-21 DIAGNOSIS — R7303 Prediabetes: Secondary | ICD-10-CM | POA: Diagnosis not present

## 2023-03-21 DIAGNOSIS — E782 Mixed hyperlipidemia: Secondary | ICD-10-CM | POA: Diagnosis not present

## 2023-03-21 DIAGNOSIS — I10 Essential (primary) hypertension: Secondary | ICD-10-CM | POA: Diagnosis not present

## 2023-03-21 DIAGNOSIS — Z8349 Family history of other endocrine, nutritional and metabolic diseases: Secondary | ICD-10-CM | POA: Diagnosis not present

## 2023-03-22 ENCOUNTER — Telehealth: Payer: Self-pay

## 2023-03-22 ENCOUNTER — Other Ambulatory Visit: Payer: Self-pay | Admitting: Internal Medicine

## 2023-03-22 DIAGNOSIS — E1165 Type 2 diabetes mellitus with hyperglycemia: Secondary | ICD-10-CM

## 2023-03-22 LAB — CBC WITH DIFFERENTIAL/PLATELET
Absolute Lymphocytes: 2464 {cells}/uL (ref 850–3900)
Absolute Monocytes: 717 {cells}/uL (ref 200–950)
Basophils Absolute: 81 {cells}/uL (ref 0–200)
Basophils Relative: 0.8 %
Eosinophils Absolute: 313 {cells}/uL (ref 15–500)
Eosinophils Relative: 3.1 %
HCT: 43.2 % (ref 35.0–45.0)
Hemoglobin: 14.4 g/dL (ref 11.7–15.5)
MCH: 27 pg (ref 27.0–33.0)
MCHC: 33.3 g/dL (ref 32.0–36.0)
MCV: 81.1 fL (ref 80.0–100.0)
MPV: 9.2 fL (ref 7.5–12.5)
Monocytes Relative: 7.1 %
Neutro Abs: 6525 {cells}/uL (ref 1500–7800)
Neutrophils Relative %: 64.6 %
Platelets: 412 10*3/uL — ABNORMAL HIGH (ref 140–400)
RBC: 5.33 10*6/uL — ABNORMAL HIGH (ref 3.80–5.10)
RDW: 14 % (ref 11.0–15.0)
Total Lymphocyte: 24.4 %
WBC: 10.1 10*3/uL (ref 3.8–10.8)

## 2023-03-22 LAB — LIPID PANEL
Cholesterol: 198 mg/dL (ref <200)
HDL: 38 mg/dL — ABNORMAL LOW (ref >=50)
LDL Cholesterol (Calc): 132 mg/dL — ABNORMAL HIGH
Non-HDL Cholesterol (Calc): 160 mg/dL — ABNORMAL HIGH (ref <130)
Total CHOL/HDL Ratio: 5.2 (calc) — ABNORMAL HIGH (ref <5.0)
Triglycerides: 153 mg/dL — ABNORMAL HIGH (ref <150)

## 2023-03-22 LAB — COMPLETE METABOLIC PANEL WITH GFR
AG Ratio: 1.8 (calc) (ref 1.0–2.5)
ALT: 20 U/L (ref 6–29)
AST: 16 U/L (ref 10–35)
Albumin: 4.5 g/dL (ref 3.6–5.1)
Alkaline phosphatase (APISO): 70 U/L (ref 37–153)
BUN: 14 mg/dL (ref 7–25)
CO2: 28 mmol/L (ref 20–32)
Calcium: 9.8 mg/dL (ref 8.6–10.4)
Chloride: 100 mmol/L (ref 98–110)
Creat: 0.74 mg/dL (ref 0.50–1.03)
Globulin: 2.5 g/dL (ref 1.9–3.7)
Glucose, Bld: 107 mg/dL — ABNORMAL HIGH (ref 65–99)
Potassium: 4.1 mmol/L (ref 3.5–5.3)
Sodium: 138 mmol/L (ref 135–146)
Total Bilirubin: 0.4 mg/dL (ref 0.2–1.2)
Total Protein: 7 g/dL (ref 6.1–8.1)
eGFR: 97 mL/min/{1.73_m2} (ref >=60)

## 2023-03-22 LAB — TSH: TSH: 2.31 m[IU]/L

## 2023-03-22 LAB — HEMOGLOBIN A1C
Hgb A1c MFr Bld: 6.8 %{Hb} — ABNORMAL HIGH (ref <5.7)
Mean Plasma Glucose: 148 mg/dL
eAG (mmol/L): 8.2 mmol/L

## 2023-03-22 MED ORDER — TIRZEPATIDE 2.5 MG/0.5ML ~~LOC~~ SOAJ: 2.5000 mg | SUBCUTANEOUS | 0 refills | Status: DC

## 2023-03-22 MED ORDER — ATORVASTATIN CALCIUM 20 MG PO TABS: 20.0000 mg | ORAL_TABLET | Freq: Every day | ORAL | 3 refills | Status: DC

## 2023-03-22 NOTE — Addendum Note (Signed)
Addended by: Margarita Mail on: 03/22/2023 08:04 AM   Modules accepted: Orders

## 2023-03-22 NOTE — Telephone Encounter (Signed)
Prior auth needs records can you adend note to reflecting her of new DM dx?

## 2023-03-22 NOTE — Telephone Encounter (Signed)
Pt called in and states her insurance will cover monjoureo. This will need a PA, and the PA can be called in as a verbal @ [phone] and the fax is [phone] per her insurace. If any other information is needed the pt can be reached at [phone].

## 2023-03-22 NOTE — Telephone Encounter (Signed)
PA started

## 2023-03-25 ENCOUNTER — Telehealth: Payer: Self-pay | Admitting: Internal Medicine

## 2023-03-25 NOTE — Telephone Encounter (Signed)
Pt stated that she was prescibed Momjuro 2.5 and her pharmacy is stating that it is not covered based the criteria she sent in with the rx. Pt wants to know what to do now.

## 2023-03-25 NOTE — Telephone Encounter (Signed)
Will send in a PA to see if insurance will approve

## 2023-04-24 ENCOUNTER — Other Ambulatory Visit: Payer: Self-pay | Admitting: Internal Medicine

## 2023-04-24 DIAGNOSIS — F329 Major depressive disorder, single episode, unspecified: Secondary | ICD-10-CM

## 2023-04-24 NOTE — Telephone Encounter (Signed)
 Requested Prescriptions  Pending Prescriptions Disp Refills   hydrOXYzine  (ATARAX ) 10 MG tablet [Pharmacy Med Name: HYDROXYZINE  HCL 10 MG TABLET] 60 tablet 0    Sig: TAKE 1 TABLET (10 MG TOTAL) BY MOUTH 2 (TWO) TIMES DAILY AS NEEDED FOR ITCHING OR ANXIETY.     Ear, Nose, and Throat:  Antihistamines 2 Passed - 04/24/2023  3:56 PM      Passed - Cr in normal range and within 360 days    Creat  Date Value Ref Range Status  03/21/2023 0.74 0.50 - 1.03 mg/dL Final         Passed - Valid encounter within last 12 months    Recent Outpatient Visits           1 month ago Primary hypertension   [facility] Rockney Cid, DO   7 months ago Primary hypertension   [facility] Rockney Cid, DO   9 months ago Annual physical exam   [facility] Rockney Cid, DO   10 months ago Primary hypertension   [facility] Rockney Cid, DO   10 months ago Hypertension, unspecified type   [facility] Rockney Cid, DO       Future Appointments             In 1 week Rockney Cid, DO [facility]   In 4 months Rockney Cid, DO [facility]

## 2023-05-03 ENCOUNTER — Telehealth: Payer: Self-pay | Admitting: Internal Medicine

## 2023-05-03 ENCOUNTER — Ambulatory Visit: Payer: 59 | Admitting: Internal Medicine

## 2023-05-03 ENCOUNTER — Encounter: Payer: Self-pay | Admitting: Internal Medicine

## 2023-05-03 VITALS — BP 132/82 | HR 76 | Temp 98.0°F | Resp 18 | Ht 64.0 in | Wt 223.0 lb

## 2023-05-03 DIAGNOSIS — I10 Essential (primary) hypertension: Secondary | ICD-10-CM

## 2023-05-03 DIAGNOSIS — E1165 Type 2 diabetes mellitus with hyperglycemia: Secondary | ICD-10-CM | POA: Diagnosis not present

## 2023-05-03 DIAGNOSIS — E782 Mixed hyperlipidemia: Secondary | ICD-10-CM | POA: Diagnosis not present

## 2023-05-03 DIAGNOSIS — Z6838 Body mass index (BMI) 38.0-38.9, adult: Secondary | ICD-10-CM

## 2023-05-03 MED ORDER — TIRZEPATIDE 2.5 MG/0.5ML ~~LOC~~ SOAJ: 2.5000 mg | SUBCUTANEOUS | 0 refills | Status: DC

## 2023-05-03 MED ORDER — ROSUVASTATIN CALCIUM 10 MG PO TABS: 10.0000 mg | ORAL_TABLET | Freq: Every day | ORAL | 1 refills | Status: DC

## 2023-05-03 NOTE — Assessment & Plan Note (Signed)
Did not like the Lipitor, however her cholesterol had really improved with it. Will discontinue Lipitor and try Crestor 10 mg.

## 2023-05-03 NOTE — Telephone Encounter (Signed)
Copied from CRM [id number]. Topic: General - Other >> May 03, 2023 11:28 AM Franchot Heidelberg wrote: Reason for CRM: Pt called reporting that she was told to call the office back immediately if tere was anything wrong with receiving her prescription. She says that her insurance will not cover it. Please advise

## 2023-05-03 NOTE — Assessment & Plan Note (Signed)
Blood pressure initially elevated but better on recheck. No changes made to medications today, continue to monitor.

## 2023-05-03 NOTE — Telephone Encounter (Signed)
PA sent thougth cover my meds

## 2023-05-03 NOTE — Assessment & Plan Note (Signed)
Trying to work on diet and weight loss, ordering Mounjaro low dose now that she has type 2 diabetes.

## 2023-05-03 NOTE — Progress Notes (Signed)
Established Patient Office Visit  Subjective   Patient ID: Morgan Gordon, female    DOB: 1970-05-10  Age: 53 y.o. MRN: [id number]  Chief Complaint  Patient presents with   Medical Management of Chronic Issues    HPI  Morgan Gordon presents to discuss new onset diabetes.   Diabetes, Type 2: -Last A1c 6.8% 12/24 -Medications: Nothing - tried to prescribe Zepbound for weight loss at LOV but not covered -Diet: cutting out fried foods, working on diet -Eye exam: Discuss at follow up -Foot exam: Discuss at follow up -Microalbumin: Discuss at follow up -Statin: yes but switching -PNA vaccine: Discuss at follow up -Denies symptoms of hypoglycemia, polyuria, polydipsia, numbness extremities, foot ulcers/trauma.   Hypertension: -Medications: HCTZ 25 mg, Lisinopril 20 mg  -Checking BP at home (average): 130-140/70-80 -Denies any SOB, CP, vision changes or symptoms of hypotension. Does have some LE edema on and off   HLD: -Medications: Lipitor 10 mg, new since last labs but patient states she's have new back muscular pain which resolves after she held the medication -Last lipid panel: Lipid Panel     Component Value Date/Time   CHOL 198 03/21/2023 1129   CHOL 152 10/05/2016 0825   TRIG 153 (H) 03/21/2023 1129   HDL 38 (L) 03/21/2023 1129   HDL 53 10/05/2016 0825   CHOLHDL 5.2 (H) 03/21/2023 1129   LDLCALC 132 (H) 03/21/2023 1129   LABVLDL 10 10/05/2016 0825    GERD: -Currently on Pepcid 20 mg, using as needed. Taking twice a week  -Controls symptoms well  -Cannot take NSAID's because they exacerbated symptoms   MDD: -Currently on Lexapro 10 mg, also has Hydroxyzine 10 mg BID - this combination is working really well for her and she reports no side effects.      03/19/2023    9:56 AM 09/14/2022    9:48 AM 07/12/2022   10:34 AM 06/12/2022    1:18 PM 06/01/2022    1:06 PM  Depression screen PHQ 2/9  Decreased Interest 0 0 0 0 2  Down, Depressed, Hopeless 0 0 0 0 2   PHQ - 2 Score 0 0 0 0 4  Altered sleeping  0 0  3  Tired, decreased energy  0 0  2  Change in appetite  0 0  1  Feeling bad or failure about yourself   0 0  3  Trouble concentrating  0 0  0  Moving slowly or fidgety/restless  0 0  0  Suicidal thoughts  0 0  1  PHQ-9 Score  0 0  14  Difficult doing work/chores  Not difficult at all Not difficult at all      Health Maintenance: -Blood work UTD -Mammogram 3/24, Birads-1 -Colon cancer screening: Cologuard 2/24 negative  -Pap negative 4/24 -Lung cancer screening done 09/13/22    Review of Systems  Cardiovascular:  Positive for leg swelling.  Musculoskeletal:  Positive for myalgias.  All other systems reviewed and are negative.     Objective:     BP 132/82   Pulse 76   Temp 98 F (36.7 C)   Resp 18   Ht 5\' 4"  (1.626 m)   Wt 223 lb (101.2 kg)   LMP 12/23/2016 (Approximate) Comment: period every 3 months due to hormone therapy - preg test neg  SpO2 94%   BMI 38.28 kg/m  BP Readings from Last 3 Encounters:  05/03/23 132/82  03/19/23 124/82  09/14/22 116/76   Wt Readings  from Last 3 Encounters:  05/03/23 223 lb (101.2 kg)  03/19/23 221 lb 12.8 oz (100.6 kg)  09/14/22 216 lb 8 oz (98.2 kg)      Physical Exam Constitutional:      Appearance: Normal appearance.  HENT:     Head: Normocephalic and atraumatic.  Eyes:     Conjunctiva/sclera: Conjunctivae normal.  Cardiovascular:     Rate and Rhythm: Normal rate and regular rhythm.  Pulmonary:     Effort: Pulmonary effort is normal.     Breath sounds: Normal breath sounds.  Musculoskeletal:     Right lower leg: No edema.     Left lower leg: No edema.  Skin:    General: Skin is warm and dry.  Neurological:     General: No focal deficit present.     Mental Status: She is alert. Mental status is at baseline.  Psychiatric:        Mood and Affect: Mood normal.        Behavior: Behavior normal.     No results found for any visits on 05/03/23.  Last CBC Lab  Results  Component Value Date   WBC 10.1 03/21/2023   HGB 14.4 03/21/2023   HCT 43.2 03/21/2023   MCV 81.1 03/21/2023   MCH 27.0 03/21/2023   RDW 14.0 03/21/2023   PLT 412 (H) 03/21/2023   Last metabolic panel Lab Results  Component Value Date   GLUCOSE 107 (H) 03/21/2023   NA 138 03/21/2023   K 4.1 03/21/2023   CL 100 03/21/2023   CO2 28 03/21/2023   BUN 14 03/21/2023   CREATININE 0.74 03/21/2023   EGFR 97 03/21/2023   CALCIUM 9.8 03/21/2023   PROT 7.0 03/21/2023   ALBUMIN 4.4 10/05/2016   LABGLOB 2.3 10/05/2016   AGRATIO 1.9 10/05/2016   BILITOT 0.4 03/21/2023   ALKPHOS 46 10/05/2016   AST 16 03/21/2023   ALT 20 03/21/2023   ANIONGAP 7 08/13/2016   Last lipids Lab Results  Component Value Date   CHOL 198 03/21/2023   HDL 38 (L) 03/21/2023   LDLCALC 132 (H) 03/21/2023   TRIG 153 (H) 03/21/2023   CHOLHDL 5.2 (H) 03/21/2023   Last hemoglobin A1c Lab Results  Component Value Date   HGBA1C 6.8 (H) 03/21/2023   Last thyroid functions Lab Results  Component Value Date   TSH 2.31 03/21/2023   Last vitamin D Lab Results  Component Value Date   VD25OH 35 04/03/2016   Last vitamin B12 and Folate No results found for: "VITAMINB12", "FOLATE"    The 10-year ASCVD risk score (Arnett DK, et al., 2019) is: 6.1%    Assessment & Plan:  Type 2 diabetes mellitus with hyperglycemia, without long-term current use of insulin (HCC) Assessment & Plan: Prescribe Mounjaro 2.5 mg, recheck A1c in 2 months and discuss other diabetic screenings as well at follow up.   Orders: -     Tirzepatide; Inject 2.5 mg into the skin once a week.  Dispense: 2 mL; Refill: 0  Primary hypertension Assessment & Plan: Blood pressure initially elevated but better on recheck. No changes made to medications today, continue to monitor.    Mixed hyperlipidemia Assessment & Plan: Did not like the Lipitor, however her cholesterol had really improved with it. Will discontinue Lipitor and try  Crestor 10 mg.   Orders: -     Rosuvastatin Calcium; Take 1 tablet (10 mg total) by mouth daily.  Dispense: 30 tablet; Refill: 1  Class 2 severe obesity  with serious comorbidity and body mass index (BMI) of 38.0 to 38.9 in adult, unspecified obesity type [facility]) Assessment & Plan: Trying to work on diet and weight loss, ordering Mounjaro low dose now that she has type 2 diabetes.   Orders: -     Tirzepatide; Inject 2.5 mg into the skin once a week.  Dispense: 2 mL; Refill: 0     Return in about 7 weeks (around 06/21/2023) for after 3/13 for A1c.    Margarita Mail, DO

## 2023-05-03 NOTE — Telephone Encounter (Signed)
Pt states insurance is not covering the mounjaro please contact her to discuss further. Pt states that [employer] would like for our office to call them to see what they will cover. Pt states she is a diabetic

## 2023-05-03 NOTE — Assessment & Plan Note (Signed)
Prescribe Mounjaro 2.5 mg, recheck A1c in 2 months and discuss other diabetic screenings as well at follow up.

## 2023-05-06 ENCOUNTER — Other Ambulatory Visit: Payer: Self-pay | Admitting: Internal Medicine

## 2023-05-06 DIAGNOSIS — I10 Essential (primary) hypertension: Secondary | ICD-10-CM

## 2023-05-06 DIAGNOSIS — E66812 Obesity, class 2: Secondary | ICD-10-CM

## 2023-05-06 DIAGNOSIS — E1165 Type 2 diabetes mellitus with hyperglycemia: Secondary | ICD-10-CM

## 2023-05-06 DIAGNOSIS — E782 Mixed hyperlipidemia: Secondary | ICD-10-CM

## 2023-05-06 MED ORDER — OZEMPIC (0.25 OR 0.5 MG/DOSE) 2 MG/3ML ~~LOC~~ SOPN: 0.2500 mg | PEN_INJECTOR | SUBCUTANEOUS | 0 refills | Status: DC

## 2023-05-06 NOTE — Addendum Note (Signed)
Addended by: Margarita Mail on: 05/06/2023 10:21 AM   Modules accepted: Orders

## 2023-05-07 ENCOUNTER — Telehealth: Payer: Self-pay | Admitting: Internal Medicine

## 2023-05-07 ENCOUNTER — Other Ambulatory Visit: Payer: Self-pay | Admitting: Internal Medicine

## 2023-05-07 DIAGNOSIS — E1165 Type 2 diabetes mellitus with hyperglycemia: Secondary | ICD-10-CM

## 2023-05-07 MED ORDER — METFORMIN HCL 500 MG PO TABS: 500.0000 mg | ORAL_TABLET | Freq: Two times a day (BID) | ORAL | 1 refills | Status: DC

## 2023-05-07 NOTE — Telephone Encounter (Signed)
Pt.notified

## 2023-05-07 NOTE — Telephone Encounter (Signed)
Copied from CRM [id number]. Topic: General - Other >> May 07, 2023 12:24 PM Macon Large wrote: Reason for CRM: Pt stated that her insurance denied her Rx for diabetes medication. Pt request call back to discuss other options. Cb# [phone]

## 2023-05-07 NOTE — Telephone Encounter (Signed)
Insurance will not cover Mounjaro, Ozempic,  or other drugs in that class. BS running 221 in afternoons.. Is there a tablet or Lantus if it would work fer her. It has to be a drug with a generic

## 2023-05-08 NOTE — Telephone Encounter (Signed)
Requested medication (s) are due for refill today - no  Requested medication (s) are on the active medication list -yes  Future visit scheduled -yes  Last refill: 05/06/23  Notes to clinic:  Pharmacy comment: Alternative Requested:DRUG NOT COVERED.   All Pharmacy Suggested Alternatives:  liraglutide (VICTOZA) 18 MG/3ML SOPN Dulaglutide (TRULICITY) 0.75 MG/0.5ML SOAJ liraglutide (VICTOZA) 18 MG/3ML SOPN    Requested Prescriptions  Pending Prescriptions Disp Refills   VICTOZA 18 MG/3ML SOPN [Pharmacy Med Name: VICTOZA 3-PAK 18 MG/3 ML PEN]  0     Endocrinology:  Diabetes - GLP-1 Receptor Agonists Passed - 05/08/2023  1:59 PM      Passed - HBA1C is between 0 and 7.9 and within 180 days    Hgb A1c MFr Bld  Date Value Ref Range Status  03/21/2023 6.8 (H) <5.7 % of total Hgb Final    Comment:    For someone without known diabetes, a hemoglobin A1c value of 6.5% or greater indicates that they may have  diabetes and this should be confirmed with a follow-up  test. . For someone with known diabetes, a value <7% indicates  that their diabetes is well controlled and a value  greater than or equal to 7% indicates suboptimal  control. A1c targets should be individualized based on  duration of diabetes, age, comorbid conditions, and  other considerations. . Currently, no consensus exists regarding use of hemoglobin A1c for diagnosis of diabetes for children. Verna Czech - Valid encounter within last 6 months    Recent Outpatient Visits           5 days ago Type 2 diabetes mellitus with hyperglycemia, without long-term current use of insulin [facility])   [place] [facility]   1 month ago Primary hypertension   [facility] Mail, DO   7 months ago Primary hypertension   [facility]   10 months ago Annual physical exam   [facility], DO   11 months ago Primary hypertension   [facility]ita Mail, DO       Future Appointments             In 1 month Margarita Mail, DO [facility] [facility], [facility]   In 4 months Margarita Mail, DO [facility], [facility]               Requested Prescriptions  Pending Prescriptions Disp Refills   VICTOZA 18 MG/3ML SOPN [Pharmacy Med Name: VICTOZA 3-PAK 18 MG/3 ML PEN]  0     Endocrinology:  Diabetes - GLP-1 Receptor Agonists Passed - 05/08/2023  1:59 PM      Passed - HBA1C is between 0 and 7.9 and within 180 days    Hgb A1c MFr Bld  Date Value Ref Range Status  03/21/2023 6.8 (H) <5.7 % of total Hgb Final    Comment:    For someone without known diabetes, a hemoglobin A1c value of 6.5% or greater indicates that they may have  diabetes and this should be confirmed with a follow-up  test. . For someone with known diabetes, a value <7% indicates  that their diabetes is well controlled and a value  greater than or equal to 7% indicates suboptimal  control. A1c targets should be individualized based on  duration of diabetes, age,  comorbid conditions, and  other considerations. . Currently, no consensus exists regarding use of hemoglobin A1c for diagnosis of diabetes for children. Verna Czech - Valid encounter within last 6 months    Recent Outpatient Visits           5 days ago Type 2 diabetes mellitus with hyperglycemia, without long-term current use of insulin [facility])   [place] [facility] Mail, DO   1 month ago Primary hypertension   [facility]ita Mail, DO   7 months ago Primary hypertension   [facility], DO   10 months ago Annual physical exam   [facility], DO   11 months ago  Primary hypertension   [facility] Mail, DO       Future Appointments             In 1 month Margarita Mail, DO [facility], [facility]   In 4 months Margarita Mail, DO [facility], [facility]

## 2023-05-10 NOTE — Telephone Encounter (Signed)
Patient is now on Metformin. The pharmacy sent this request and a list of alternatives. She would prefer to be on one of these medications due to her stomach issues. Metformin gives diarrhea. It says Victoza nd Trulicity are alternative drug

## 2023-05-14 ENCOUNTER — Encounter: Payer: Self-pay | Admitting: Internal Medicine

## 2023-05-15 ENCOUNTER — Other Ambulatory Visit: Payer: Self-pay | Admitting: Internal Medicine

## 2023-05-15 DIAGNOSIS — E1165 Type 2 diabetes mellitus with hyperglycemia: Secondary | ICD-10-CM

## 2023-05-15 DIAGNOSIS — E66812 Obesity, class 2: Secondary | ICD-10-CM

## 2023-05-15 MED ORDER — TRULICITY 0.75 MG/0.5ML ~~LOC~~ SOAJ: 0.7500 mg | SUBCUTANEOUS | 0 refills | Status: DC

## 2023-05-17 NOTE — Telephone Encounter (Signed)
 Insurance paid for [employer]

## 2023-05-19 ENCOUNTER — Other Ambulatory Visit: Payer: Self-pay | Admitting: Internal Medicine

## 2023-05-19 DIAGNOSIS — F329 Major depressive disorder, single episode, unspecified: Secondary | ICD-10-CM

## 2023-05-20 NOTE — Telephone Encounter (Signed)
 Requested Prescriptions  Pending Prescriptions Disp Refills   hydrOXYzine  (ATARAX ) 10 MG tablet [Pharmacy Med Name: HYDROXYZINE  HCL 10 MG TABLET] 180 tablet 0    Sig: TAKE 1 TABLET (10 MG TOTAL) BY MOUTH 2 (TWO) TIMES DAILY AS NEEDED FOR ITCHING OR ANXIETY.     Ear, Nose, and Throat:  Antihistamines 2 Passed - 05/20/2023  5:21 PM      Passed - Cr in normal range and within 360 days    Creat  Date Value Ref Range Status  03/21/2023 0.74 0.50 - 1.03 mg/dL Final         Passed - Valid encounter within last 12 months    Recent Outpatient Visits           2 weeks ago Type 2 diabetes mellitus with hyperglycemia, without long-term current use of insulin [facility])   [place] [facility] Cid, DO   2 months ago Primary hypertension   [facility]   8 months ago Primary hypertension   [facility] Cid, DO   10 months ago Annual physical exam   [facility], DO   11 months ago Primary hypertension   [facility] C[facility], DO       Future Appointments             In 1 month [facility] Cid, DO [facility]   In 4 months Rock[facility] Cid, DO [facility]

## 2023-05-23 ENCOUNTER — Other Ambulatory Visit: Payer: Self-pay | Admitting: Internal Medicine

## 2023-05-23 DIAGNOSIS — I10 Essential (primary) hypertension: Secondary | ICD-10-CM

## 2023-05-23 DIAGNOSIS — F329 Major depressive disorder, single episode, unspecified: Secondary | ICD-10-CM

## 2023-05-23 NOTE — Telephone Encounter (Signed)
Requested Prescriptions  Pending Prescriptions Disp Refills   hydrochlorothiazide (HYDRODIURIL) 25 MG tablet [Pharmacy Med Name: HYDROCHLOROTHIAZIDE 25 MG TAB] 30 tablet 2    Sig: TAKE 1 TABLET (25 MG TOTAL) BY MOUTH DAILY.     Cardiovascular: Diuretics - Thiazide Passed - 05/23/2023  1:56 PM      Passed - Cr in normal range and within 180 days    Creat  Date Value Ref Range Status  03/21/2023 0.74 0.50 - 1.03 mg/dL Final         Passed - K in normal range and within 180 days    Potassium  Date Value Ref Range Status  03/21/2023 4.1 3.5 - 5.3 mmol/L Final         Passed - Na in normal range and within 180 days    Sodium  Date Value Ref Range Status  03/21/2023 138 135 - 146 mmol/L Final  10/05/2016 138 134 - 144 mmol/L Final         Passed - Last BP in normal range    BP Readings from Last 1 Encounters:  05/03/23 132/82         Passed - Valid encounter within last 6 months    Recent Outpatient Visits           2 weeks ago Type 2 diabetes mellitus with hyperglycemia, without long-term current use of insulin (HCC)   [place] [facility]arita Mail, DO   2 months ago Primary hypertension   [place] [facility] Mail, DO   8 months ago Primary hypertension   [facility]ita Mail, DO   10 months ago Annual physical exam   [facility]   11 months ago Primary hypertension   [facility]ita Mail, DO       Future Appointments             In 1 month Margarita Mail, DO [facility]   In 3 months Margarita Mail, DO [place] [facility], [facility]             escitalopram (LEXAPRO) 10 MG tablet [Pharmacy Med Name: ESCITALOPRAM 10 MG TABLET] 30 tablet 2    Sig: TAKE 1 TABLET BY MOUTH EVERY DAY     Psychiatry:  Antidepressants - SSRI Passed -  05/23/2023  1:56 PM      Passed - Valid encounter within last 6 months    Recent Outpatient Visits           2 weeks ago Type 2 diabetes mellitus with hyperglycemia, without long-term current use of insulin [facility])    [facility] DO   2 months ago Primary hypertension   [facility] Mail, DO   8 months ago Primary hypertension   [facility], DO   10 months ago Annual physical exam   [facility] DO   11 months ago Primary hypertension   [facility], DO       Future Appointments             In 1 month Margarita Mail, DO [facility], [facility]   In 3 months [facility] Mail, DO [facility], [facility]  lisinopril (ZESTRIL) 20 MG tablet [Pharmacy Med Name: LISINOPRIL 20 MG TABLET] 30 tablet 2    Sig: TAKE 1 TABLET BY MOUTH EVERY DAY     Cardiovascular:  ACE Inhibitors Passed - 05/23/2023  1:56 PM      Passed - Cr in normal range and within 180 days    Creat  Date Value Ref Range Status  03/21/2023 0.74 0.50 - 1.03 mg/dL Final         Passed - K in normal range and within 180 days    Potassium  Date Value Ref Range Status  03/21/2023 4.1 3.5 - 5.3 mmol/L Final         Passed - Patient is not pregnant      Passed - Last BP in normal range    BP Readings from Last 1 Encounters:  05/03/23 132/82         Passed - Valid encounter within last 6 months    Recent Outpatient Visits           2 weeks ago Type 2 diabetes mellitus with hyperglycemia, without long-term current use of insulin [facility])   [place] [facility] Mail, DO   2 months ago Primary hypertension   [facility] Mail, DO   8 months ago Primary hypertension    [facility] Mail, DO   10 months ago Annual physical exam   [facility] Mail, DO   11 months ago Primary hypertension   [facility] Mail, DO       Future Appointments             In 1 month Margarita Mail, DO [facility], [facility]   In 3 months Marg[facility]ita Mail, DO [facility], [facility]

## 2023-05-26 ENCOUNTER — Other Ambulatory Visit: Payer: Self-pay | Admitting: Internal Medicine

## 2023-05-26 DIAGNOSIS — E782 Mixed hyperlipidemia: Secondary | ICD-10-CM

## 2023-05-27 NOTE — Telephone Encounter (Signed)
 Requested Prescriptions  Refused Prescriptions Disp Refills   rosuvastatin (CRESTOR) 10 MG tablet [Pharmacy Med Name: ROSUVASTATIN CALCIUM 10 MG TAB] 90 tablet 1    Sig: TAKE 1 TABLET BY MOUTH EVERY DAY     Cardiovascular:  Antilipid - Statins 2 Failed - 05/27/2023  4:50 PM      Failed - Lipid Panel in normal range within the last 12 months    Cholesterol, Total  Date Value Ref Range Status  10/05/2016 152 100 - 199 mg/dL Final   Cholesterol  Date Value Ref Range Status  03/21/2023 198 <200 mg/dL Final   LDL Cholesterol (Calc)  Date Value Ref Range Status  03/21/2023 132 (H) mg/dL (calc) Final    Comment:    Reference range: <100 . Desirable range <100 mg/dL for primary prevention;   <70 mg/dL for patients with CHD or diabetic patients  with > or = 2 CHD risk factors. Marland Kitchen LDL-C is now calculated using the Martin-Hopkins  calculation, which is a validated novel method providing  better accuracy than the Friedewald equation in the  estimation of LDL-C.  Horald Pollen et al. Lenox Ahr. 1610;960(45): 2061-2068  (http://education.[facility].com/faq/FAQ164)    HDL  Date Value Ref Range Status  03/21/2023 38 (L) > OR = 50 mg/dL Final  40/98/1191 53 >47 mg/dL Final   Triglycerides  Date Value Ref Range Status  03/21/2023 153 (H) <150 mg/dL Final         Passed - Cr in normal range and within 360 days    Creat  Date Value Ref Range Status  03/21/2023 0.74 0.50 - 1.03 mg/dL Final         Passed - Patient is not pregnant      Passed - Valid encounter within last 12 months    Recent Outpatient Visits           3 weeks ago Type 2 diabetes mellitus with hyperglycemia, without long-term current use of insulin [facility])   [place] [facility], DO   2 months ago Primary hypertension   [facility] Mail, DO   8 months ago Primary hypertension   [facility] Mail, DO    10 months ago Annual physical exam   [facility] Mail, DO   11 months ago Primary hypertension   [facility] Margarita Mail, DO       Future Appointments             In 4 weeks [facility] Mail, DO [facility], [facility]   In 3 months [facility]ita Mail, DO [facility], [facility]

## 2023-06-13 ENCOUNTER — Other Ambulatory Visit: Payer: Self-pay | Admitting: Internal Medicine

## 2023-06-13 DIAGNOSIS — E1165 Type 2 diabetes mellitus with hyperglycemia: Secondary | ICD-10-CM

## 2023-06-13 DIAGNOSIS — E66812 Obesity, class 2: Secondary | ICD-10-CM

## 2023-06-14 NOTE — Telephone Encounter (Signed)
 Requested Prescriptions  Pending Prescriptions Disp Refills   Dulaglutide (TRULICITY) 0.75 MG/0.5ML SOAJ [Pharmacy Med Name: TRULICITY 0.75 MG/0.5 ML PEN] 2 mL 0    Sig: INJECT 0.75 MG SUBCUTANEOUSLY ONE TIME PER WEEK     Endocrinology:  Diabetes - GLP-1 Receptor Agonists Passed - 06/14/2023 10:28 AM      Passed - HBA1C is between 0 and 7.9 and within 180 days    Hgb A1c MFr Bld  Date Value Ref Range Status  03/21/2023 6.8 (H) <5.7 % of total Hgb Final    Comment:    For someone without known diabetes, a hemoglobin A1c value of 6.5% or greater indicates that they may have  diabetes and this should be confirmed with a follow-up  test. . For someone with known diabetes, a value <7% indicates  that their diabetes is well controlled and a value  greater than or equal to 7% indicates suboptimal  control. A1c targets should be individualized based on  duration of diabetes, age, comorbid conditions, and  other considerations. . Currently, no consensus exists regarding use of hemoglobin A1c for diagnosis of diabetes for children. Verna Czech - Valid encounter within last 6 months    Recent Outpatient Visits           1 month ago Type 2 diabetes mellitus with hyperglycemia, without long-term current use of insulin [facility])   [facility] Mail, DO   2 months ago Primary hypertension   [facility], DO   9 months ago Primary hypertension   [facility] Marg[facility]ita Mail, DO   11 months ago Annual physical exam   [facility] Mail, DO   1 year ago Primary hypertension   [facility] Mail, DO       Future Appointments             In 1 week Margarita Mail, DO [facility], [facility]   In 3 months Margarita Mail, DO [facility], [facility]

## 2023-06-17 ENCOUNTER — Ambulatory Visit: Payer: Self-pay | Admitting: Internal Medicine

## 2023-06-17 NOTE — Telephone Encounter (Signed)
 Copied from CRM [id number]. Topic: Clinical - Prescription Issue >> Jun 17, 2023  8:52 AM Marland Kitchen D wrote: Patient is requesting a call back because she switched her health insurance. And needs to office to call insurance [employer] to be a participant and she needs a refill on her medicine. Patient asked to speak with Janann August she said she would know what to do.

## 2023-06-17 NOTE — Telephone Encounter (Signed)
 This patient is stating that Dr.Andrews is not a participant of this insurance. The practice name was there. Patient stated she cannot get any refills or visits until she is a participant. She stated she would have to be added. Please advise

## 2023-06-22 ENCOUNTER — Other Ambulatory Visit: Payer: Self-pay | Admitting: Internal Medicine

## 2023-06-22 DIAGNOSIS — E782 Mixed hyperlipidemia: Secondary | ICD-10-CM

## 2023-06-24 NOTE — Telephone Encounter (Signed)
 Requested by interface surescripts. Future visit tomorrow.  Requested Prescriptions  Pending Prescriptions Disp Refills   rosuvastatin (CRESTOR) 10 MG tablet [Pharmacy Med Name: ROSUVASTATIN CALCIUM 10 MG TAB] 30 tablet 0    Sig: TAKE 1 TABLET BY MOUTH EVERY DAY     Cardiovascular:  Antilipid - Statins 2 Failed - 06/24/2023  1:49 PM      Failed - Lipid Panel in normal range within the last 12 months    Cholesterol, Total  Date Value Ref Range Status  10/05/2016 152 100 - 199 mg/dL Final   Cholesterol  Date Value Ref Range Status  03/21/2023 198 <200 mg/dL Final   LDL Cholesterol (Calc)  Date Value Ref Range Status  03/21/2023 132 (H) mg/dL (calc) Final    Comment:    Reference range: <100 . Desirable range <100 mg/dL for primary prevention;   <70 mg/dL for patients with CHD or diabetic patients  with > or = 2 CHD risk factors. Marland Kitchen LDL-C is now calculated using the Martin-Hopkins  calculation, which is a validated novel method providing  better accuracy than the Friedewald equation in the  estimation of LDL-C.  Horald Pollen et al. Lenox Ahr. 0454;098(11): 2061-2068  (http://education.[facility].com/faq/FAQ164)    HDL  Date Value Ref Range Status  03/21/2023 38 (L) > OR = 50 mg/dL Final  91/47/8295 53 >62 mg/dL Final   Triglycerides  Date Value Ref Range Status  03/21/2023 153 (H) <150 mg/dL Final         Passed - Cr in normal range and within 360 days    Creat  Date Value Ref Range Status  03/21/2023 0.74 0.50 - 1.03 mg/dL Final         Passed - Patient is not pregnant      Passed - Valid encounter within last 12 months    Recent Outpatient Visits           1 month ago Type 2 diabetes mellitus with hyperglycemia, without long-term current use of insulin [facility])   [place] [facility] Mail, DO   3 months ago Primary hypertension   [facility]ita Mail, DO   9 months ago Primary hypertension    [facility] Mail, DO   11 months ago Annual physical exam   [facility], DO   1 year ago Primary hypertension   [facility] Mail, DO       Future Appointments             Tomorrow Margarita Mail, DO [place] [facility]   In 2 months Margarita Mail, DO [facility], [facility]

## 2023-06-25 ENCOUNTER — Other Ambulatory Visit: Payer: Self-pay

## 2023-06-25 ENCOUNTER — Encounter: Payer: Self-pay | Admitting: Internal Medicine

## 2023-06-25 ENCOUNTER — Ambulatory Visit: Payer: Self-pay | Admitting: Internal Medicine

## 2023-06-25 VITALS — BP 120/76 | HR 82 | Temp 97.8°F | Resp 16 | Ht 64.0 in | Wt 216.1 lb

## 2023-06-25 DIAGNOSIS — Z7985 Long-term (current) use of injectable non-insulin antidiabetic drugs: Secondary | ICD-10-CM

## 2023-06-25 DIAGNOSIS — E7800 Pure hypercholesterolemia, unspecified: Secondary | ICD-10-CM | POA: Diagnosis not present

## 2023-06-25 DIAGNOSIS — Z1231 Encounter for screening mammogram for malignant neoplasm of breast: Secondary | ICD-10-CM

## 2023-06-25 DIAGNOSIS — F329 Major depressive disorder, single episode, unspecified: Secondary | ICD-10-CM

## 2023-06-25 DIAGNOSIS — I10 Essential (primary) hypertension: Secondary | ICD-10-CM | POA: Diagnosis not present

## 2023-06-25 DIAGNOSIS — Z6838 Body mass index (BMI) 38.0-38.9, adult: Secondary | ICD-10-CM

## 2023-06-25 DIAGNOSIS — E1165 Type 2 diabetes mellitus with hyperglycemia: Secondary | ICD-10-CM

## 2023-06-25 LAB — POCT GLYCOSYLATED HEMOGLOBIN (HGB A1C): Hemoglobin A1C: 5.7 % — AB (ref 4.0–5.6)

## 2023-06-25 MED ORDER — TRULICITY 0.75 MG/0.5ML ~~LOC~~ SOAJ: 0.7500 mg | SUBCUTANEOUS | 2 refills | Status: DC

## 2023-06-25 MED ORDER — ROSUVASTATIN CALCIUM 20 MG PO TABS: 20.0000 mg | ORAL_TABLET | Freq: Every day | ORAL | 3 refills | Status: AC

## 2023-06-25 NOTE — Assessment & Plan Note (Signed)
 Moods stable on Lexapro, no changes.

## 2023-06-25 NOTE — Progress Notes (Signed)
 Established Patient Office Visit  Subjective   Patient ID: Morgan Gordon, female    DOB: 30-Mar-1971  Age: 53 y.o. MRN: [id number]  Chief Complaint  Patient presents with   Diabetes    Diabetes   Morgan Gordon presents to discuss new onset diabetes.   Diabetes, Type 2: -Last A1c 6.8% 12/24 -Medications: Truclity 0.75 mg - tried to prescribe Zepbound for weight loss at LOV but not covered. Had been doing really well on the Trulicity, no side effects but her insurance changed on 06/08/23 and she has not been able to get it since -Diet: cutting out fried foods, working on diet -Eye exam: Has an eye doctor, will up date them on diagnosis for diabetic eye exam -Foot exam: Due today -Microalbumin: Due today -Statin: yes -PNA vaccine: Discuss at follow up -Denies symptoms of hypoglycemia, polyuria, polydipsia, numbness extremities, foot ulcers/trauma.   Hypertension: -Medications: HCTZ 25 mg, Lisinopril 20 mg  -Checking BP at home (average): 130-140/70-80 -Denies any SOB, CP, vision changes or symptoms of hypotension. Does have some LE edema on and off   HLD: -Medications: Switched to Crestor 10 mg at LOV from Lipitor due to muscle pain, so far doing well with the change -Last lipid panel: Lipid Panel     Component Value Date/Time   CHOL 198 03/21/2023 1129   CHOL 152 10/05/2016 0825   TRIG 153 (H) 03/21/2023 1129   HDL 38 (L) 03/21/2023 1129   HDL 53 10/05/2016 0825   CHOLHDL 5.2 (H) 03/21/2023 1129   LDLCALC 132 (H) 03/21/2023 1129   LABVLDL 10 10/05/2016 0825    GERD: -Currently on Pepcid 20 mg, using as needed. Taking twice a week  -Controls symptoms well  -Cannot take NSAID's because they exacerbated symptoms   MDD: -Currently on Lexapro 10 mg, also has Hydroxyzine 10 mg BID - this combination is working really well for her and she reports no side effects.      06/25/2023    9:42 AM 03/19/2023    9:56 AM 09/14/2022    9:48 AM 07/12/2022   10:34 AM  06/12/2022    1:18 PM  Depression screen PHQ 2/9  Decreased Interest 0 0 0 0 0  Down, Depressed, Hopeless 0 0 0 0 0  PHQ - 2 Score 0 0 0 0 0  Altered sleeping   0 0   Tired, decreased energy   0 0   Change in appetite   0 0   Feeling bad or failure about yourself    0 0   Trouble concentrating   0 0   Moving slowly or fidgety/restless   0 0   Suicidal thoughts   0 0   PHQ-9 Score   0 0   Difficult doing work/chores   Not difficult at all Not difficult at all     Health Maintenance: -Blood work UTD -Mammogram 3/24, Birads-1, due for this year -Colon cancer screening: Cologuard 2/24 negative  -Pap negative 4/24 -Lung cancer screening done 09/13/22    Review of Systems  Gastrointestinal:  Negative for abdominal pain, constipation, heartburn, nausea and vomiting.  All other systems reviewed and are negative.     Objective:     BP 120/76 (Cuff Size: Large)   Pulse 82   Temp 97.8 F (36.6 C) (Oral)   Resp 16   Ht 5\' 4"  (1.626 m)   Wt 216 lb 1.6 oz (98 kg)   LMP 12/23/2016 (Approximate) Comment: period every 3 months  due to hormone therapy - preg test neg  SpO2 98%   BMI 37.09 kg/m  BP Readings from Last 3 Encounters:  06/25/23 120/76  05/03/23 132/82  03/19/23 124/82   Wt Readings from Last 3 Encounters:  06/25/23 216 lb 1.6 oz (98 kg)  05/03/23 223 lb (101.2 kg)  03/19/23 221 lb 12.8 oz (100.6 kg)      Physical Exam Constitutional:      Appearance: Normal appearance.  HENT:     Head: Normocephalic and atraumatic.  Eyes:     Conjunctiva/sclera: Conjunctivae normal.  Cardiovascular:     Rate and Rhythm: Normal rate and regular rhythm.     Pulses:          Dorsalis pedis pulses are 2+ on the right side and 2+ on the left side.  Pulmonary:     Effort: Pulmonary effort is normal.     Breath sounds: Normal breath sounds.  Musculoskeletal:     Right lower leg: No edema.     Left lower leg: No edema.     Right foot: Normal range of motion. No deformity,  bunion, Charcot foot, foot drop or prominent metatarsal heads.     Left foot: Normal range of motion. No deformity, bunion, Charcot foot, foot drop or prominent metatarsal heads.  Feet:     Right foot:     Protective Sensation: 6 sites tested.  6 sites sensed.     Skin integrity: Skin integrity normal.     Toenail Condition: Right toenails are normal.     Left foot:     Protective Sensation: 6 sites tested.  6 sites sensed.     Skin integrity: Skin integrity normal.     Toenail Condition: Left toenails are normal.  Skin:    General: Skin is warm and dry.  Neurological:     General: No focal deficit present.     Mental Status: She is alert. Mental status is at baseline.  Psychiatric:        Mood and Affect: Mood normal.        Behavior: Behavior normal.     Results for orders placed or performed in visit on 06/25/23  POCT HgB A1C  Result Value Ref Range   Hemoglobin A1C 5.7 (A) 4.0 - 5.6 %   HbA1c POC (<> result, manual entry)     HbA1c, POC (prediabetic range)     HbA1c, POC (controlled diabetic range)      Last CBC Lab Results  Component Value Date   WBC 10.1 03/21/2023   HGB 14.4 03/21/2023   HCT 43.2 03/21/2023   MCV 81.1 03/21/2023   MCH 27.0 03/21/2023   RDW 14.0 03/21/2023   PLT 412 (H) 03/21/2023   Last metabolic panel Lab Results  Component Value Date   GLUCOSE 107 (H) 03/21/2023   NA 138 03/21/2023   K 4.1 03/21/2023   CL 100 03/21/2023   CO2 28 03/21/2023   BUN 14 03/21/2023   CREATININE 0.74 03/21/2023   EGFR 97 03/21/2023   CALCIUM 9.8 03/21/2023   PROT 7.0 03/21/2023   ALBUMIN 4.4 10/05/2016   LABGLOB 2.3 10/05/2016   AGRATIO 1.9 10/05/2016   BILITOT 0.4 03/21/2023   ALKPHOS 46 10/05/2016   AST 16 03/21/2023   ALT 20 03/21/2023   ANIONGAP 7 08/13/2016   Last lipids Lab Results  Component Value Date   CHOL 198 03/21/2023   HDL 38 (L) 03/21/2023   LDLCALC 132 (H) 03/21/2023   TRIG  153 (H) 03/21/2023   CHOLHDL 5.2 (H) 03/21/2023    Last hemoglobin A1c Lab Results  Component Value Date   HGBA1C 5.7 (A) 06/25/2023   Last thyroid functions Lab Results  Component Value Date   TSH 2.31 03/21/2023   Last vitamin D Lab Results  Component Value Date   VD25OH 35 04/03/2016   Last vitamin B12 and Folate No results found for: "VITAMINB12", "FOLATE"    The 10-year ASCVD risk score (Arnett DK, et al., 2019) is: 5%    Assessment & Plan:  Type 2 diabetes mellitus with hyperglycemia, without long-term current use of insulin (HCC) Assessment & Plan: A1c improved to 5.7%. Refill Trulicity 0.75 mg weekly. Foot exam and microalbumin today as well.   Orders: -     POCT glycosylated hemoglobin (Hb A1C) -     HM Diabetes Foot Exam -     Microalbumin / creatinine urine ratio -     Trulicity; Inject 0.75 mg into the skin once a week.  Dispense: 2 mL; Refill: 2  Class 2 severe obesity with serious comorbidity and body mass index (BMI) of 38.0 to 38.9 in adult, unspecified obesity type (HCC) Assessment & Plan: Losing weight, lost about 7 pounds since LOV. Restart Trulicity.   Orders: -     Trulicity; Inject 0.75 mg into the skin once a week.  Dispense: 2 mL; Refill: 2  Pure hypercholesterolemia Assessment & Plan: Reviewed most recent lipid panel with the patient, will increase Crestor to 20 mg daily.  Orders: -     Rosuvastatin Calcium; Take 1 tablet (20 mg total) by mouth daily.  Dispense: 90 tablet; Refill: 3  Primary hypertension Assessment & Plan: Blood pressure stable here today, no changes made to medications and appropriate refills sent to pharmacy.     Current episode of major depressive disorder without prior episode, unspecified depression episode severity Assessment & Plan: Moods stable on Lexapro, no changes.    Encounter for screening mammogram for malignant neoplasm of breast -     3D Screening Mammogram, Left and Right; Future     Return in about 3 months (around 09/25/2023).     Margarita Mail, DO

## 2023-06-25 NOTE — Assessment & Plan Note (Signed)
 Blood pressure stable here today, no changes made to medications and appropriate refills sent to pharmacy.

## 2023-06-25 NOTE — Assessment & Plan Note (Signed)
 Reviewed most recent lipid panel with the patient, will increase Crestor to 20 mg daily.

## 2023-06-25 NOTE — Assessment & Plan Note (Signed)
 Losing weight, lost about 7 pounds since LOV. Restart Trulicity.

## 2023-06-25 NOTE — Assessment & Plan Note (Signed)
 A1c improved to 5.7%. Refill Trulicity 0.75 mg weekly. Foot exam and microalbumin today as well.

## 2023-06-26 LAB — MICROALBUMIN / CREATININE URINE RATIO
Creatinine, Urine: 69 mg/dL (ref 20–275)
Microalb Creat Ratio: 6 mg/g{creat} (ref <30)
Microalb, Ur: 0.4 mg/dL

## 2023-07-02 ENCOUNTER — Telehealth: Payer: Self-pay | Admitting: Internal Medicine

## 2023-07-02 NOTE — Telephone Encounter (Signed)
 Insurance changed to [employer]. WIll no longer pay for Trulicity. Need alternative.

## 2023-07-02 NOTE — Telephone Encounter (Signed)
 Copied from CRM [id number]. Topic: Clinical - Prescription Issue >> Jul 02, 2023 12:02 PM Turkey B wrote: Reason for CRM: pt called in, says needs alaternative to Trulicity, she isnt sure why insurance isnt covering it

## 2023-07-03 ENCOUNTER — Encounter: Payer: Self-pay | Admitting: Emergency Medicine

## 2023-07-03 NOTE — Telephone Encounter (Signed)
 Spoke to [employer]. Since patient is new to them she has to do step therapy starting with Metformin. Patient stated she do not take metformin  do to diarrhea.

## 2023-07-03 NOTE — Telephone Encounter (Signed)
 PA sent to [employer] waiting on response

## 2023-07-04 ENCOUNTER — Other Ambulatory Visit: Payer: Self-pay | Admitting: Emergency Medicine

## 2023-07-04 DIAGNOSIS — E1165 Type 2 diabetes mellitus with hyperglycemia: Secondary | ICD-10-CM

## 2023-07-04 MED ORDER — METFORMIN HCL 500 MG PO TABS
500.0000 mg | ORAL_TABLET | Freq: Every day | ORAL | 0 refills | Status: DC
Start: 2023-07-04 — End: 2023-07-08

## 2023-07-05 ENCOUNTER — Other Ambulatory Visit: Payer: Self-pay | Admitting: Internal Medicine

## 2023-07-05 DIAGNOSIS — E1165 Type 2 diabetes mellitus with hyperglycemia: Secondary | ICD-10-CM

## 2023-07-08 NOTE — Telephone Encounter (Signed)
 Requested Prescriptions  Pending Prescriptions Disp Refills   metFORMIN (GLUCOPHAGE) 500 MG tablet [Pharmacy Med Name: METFORMIN HCL 500 MG TABLET] 90 tablet 0    Sig: TAKE 1 TABLET BY MOUTH EVERY DAY WITH BREAKFAST     Endocrinology:  Diabetes - Biguanides Failed - 07/08/2023  1:37 PM      Failed - B12 Level in normal range and within 720 days    No results found for: "VITAMINB12"       Passed - Cr in normal range and within 360 days    Creat  Date Value Ref Range Status  03/21/2023 0.74 0.50 - 1.03 mg/dL Final   Creatinine, Urine  Date Value Ref Range Status  06/25/2023 69 20 - 275 mg/dL Final         Passed - HBA1C is between 0 and 7.9 and within 180 days    Hemoglobin A1C  Date Value Ref Range Status  06/25/2023 5.7 (A) 4.0 - 5.6 % Final   Hgb A1c MFr Bld  Date Value Ref Range Status  03/21/2023 6.8 (H) <5.7 % of total Hgb Final    Comment:    For someone without known diabetes, a hemoglobin A1c value of 6.5% or greater indicates that they may have  diabetes and this should be confirmed with a follow-up  test. . For someone with known diabetes, a value <7% indicates  that their diabetes is well controlled and a value  greater than or equal to 7% indicates suboptimal  control. A1c targets should be individualized based on  duration of diabetes, age, comorbid conditions, and  other considerations. . Currently, no consensus exists regarding use of hemoglobin A1c for diagnosis of diabetes for children. .          Passed - eGFR in normal range and within 360 days    GFR calc Af Amer  Date Value Ref Range Status  10/05/2016 86 >59 mL/min/1.73 Final   GFR calc non Af Amer  Date Value Ref Range Status  10/05/2016 74 >59 mL/min/1.73 Final   eGFR  Date Value Ref Range Status  03/21/2023 97 > OR = 60 mL/min/1.8m2 Final         Passed - Valid encounter within last 6 months    Recent Outpatient Visits           1 week ago Type 2 diabetes mellitus with  hyperglycemia, without long-term current use of insulin Morgan Gordon)   Morgan Gordon Morgan Mail, DO       Future Appointments             In 2 months Morgan Mail, DO [facility], [facility]   In 3 months Morgan Mail, DO  [facility], [facility]            Passed - CBC within normal limits and completed in the last 12 months    WBC  Date Value Ref Range Status  03/21/2023 10.1 3.8 - 10.8 Thousand/uL Final   RBC  Date Value Ref Range Status  03/21/2023 5.33 (H) 3.80 - 5.10 Million/uL Final   Hemoglobin  Date Value Ref Range Status  03/21/2023 14.4 11.7 - 15.5 g/dL Final   HCT  Date Value Ref Range Status  03/21/2023 43.2 35.0 - 45.0 % Final   MCHC  Date Value Ref Range Status  03/21/2023 33.3 32.0 - 36.0 g/dL Final    Comment:    For adults, a slight decrease in the calculated  MCHC value (in the range of 30 to 32 g/dL) is most likely not clinically significant; however, it should be interpreted with caution in correlation with other red cell parameters and the patient's clinical condition.    [facility]  Date Value Ref Range Status  03/21/2023 27.0 27.0 - 33.0 pg Final   MCV  Date Value Ref Range Status  03/21/2023 81.1 80.0 - 100.0 fL Final   No results found for: "PLTCOUNTKUC", "LABPLAT", "POCPLA" RDW  Date Value Ref Range Status  03/21/2023 14.0 11.0 - 15.0 % Final

## 2023-07-09 ENCOUNTER — Other Ambulatory Visit: Payer: Self-pay | Admitting: Internal Medicine

## 2023-07-09 DIAGNOSIS — F329 Major depressive disorder, single episode, unspecified: Secondary | ICD-10-CM

## 2023-07-11 NOTE — Telephone Encounter (Signed)
 Requested Prescriptions  Refused Prescriptions Disp Refills   hydrOXYzine (ATARAX) 10 MG tablet [Pharmacy Med Name: HYDROXYZINE HCL 10 MG TABLET] 180 tablet 1    Sig: TAKE 1 TABLET (10 MG TOTAL) BY MOUTH 2 (TWO) TIMES DAILY AS NEEDED FOR ITCHING OR ANXIETY.     Ear, Nose, and Throat:  Antihistamines 2 Passed - 07/11/2023 10:00 AM      Passed - Cr in normal range and within 360 days    Creat  Date Value Ref Range Status  03/21/2023 0.74 0.50 - 1.03 mg/dL Final   Creatinine, Urine  Date Value Ref Range Status  06/25/2023 69 20 - 275 mg/dL Final         Passed - Valid encounter within last 12 months    Recent Outpatient Visits           2 weeks ago Type 2 diabetes mellitus with hyperglycemia, without long-term current use of insulin [facility])   [facility] Margarita Mail, DO       Future Appointments             In 2 months Margarita Mail, DO [facility], [facility]   In 3 months Margarita Mail, DO [facility], [facility]

## 2023-07-20 ENCOUNTER — Other Ambulatory Visit: Payer: Self-pay | Admitting: Internal Medicine

## 2023-07-20 DIAGNOSIS — F329 Major depressive disorder, single episode, unspecified: Secondary | ICD-10-CM

## 2023-07-22 NOTE — Telephone Encounter (Signed)
 Requested Prescriptions  Pending Prescriptions Disp Refills   hydrOXYzine (ATARAX) 10 MG tablet [Pharmacy Med Name: HYDROXYZINE HCL 10 MG TABLET] 60 tablet 2    Sig: TAKE 1 TABLET (10 MG TOTAL) BY MOUTH 2 (TWO) TIMES DAILY AS NEEDED FOR ITCHING OR ANXIETY.     Ear, Nose, and Throat:  Antihistamines 2 Passed - 07/22/2023 12:20 PM      Passed - Cr in normal range and within 360 days    Creat  Date Value Ref Range Status  03/21/2023 0.74 0.50 - 1.03 mg/dL Final   Creatinine, Urine  Date Value Ref Range Status  06/25/2023 69 20 - 275 mg/dL Final         Passed - Valid encounter within last 12 months    Recent Outpatient Visits           3 weeks ago Type 2 diabetes mellitus with hyperglycemia, without long-term current use of insulin [facility])   [place] [facility] Rockney Cid, DO       Future Appointments             In 1 month Rockney Cid, DO [facility], [facility]   In 2 months Rockney Cid, DO [facility], [facility]

## 2023-08-08 ENCOUNTER — Telehealth: Payer: Self-pay | Admitting: Internal Medicine

## 2023-08-08 NOTE — Telephone Encounter (Signed)
 RN called pt for more information. Pt did not answer, RN LVM with a callback number.  Copied from CRM [id number]. Topic: Clinical - Prescription Issue >> Aug 08, 2023  9:12 AM Alpha Arts wrote: Reason for CRM: Patient is having a hard time getting her Dulaglutide (TRULICITY) 0.75 MG/0.5ML SOAJ approved at the pharmacy. She would also like a stronger dosage.   Callback #: [phone]  [facility] [address] [place] - [address] [place] [address] Phone: [phone] Fax: [phone] Hours: Not open 24 hours

## 2023-08-08 NOTE — Telephone Encounter (Signed)
 Spoke to patient.  Can this dose be increased to next dosage up on Trulicity.

## 2023-08-09 ENCOUNTER — Other Ambulatory Visit: Payer: Self-pay | Admitting: Internal Medicine

## 2023-08-09 DIAGNOSIS — E1165 Type 2 diabetes mellitus with hyperglycemia: Secondary | ICD-10-CM

## 2023-08-09 MED ORDER — TRULICITY 1.5 MG/0.5ML ~~LOC~~ SOAJ
1.5000 mg | SUBCUTANEOUS | 0 refills | Status: DC
Start: 2023-08-09 — End: 2023-09-03

## 2023-08-17 ENCOUNTER — Other Ambulatory Visit: Payer: Self-pay | Admitting: Internal Medicine

## 2023-08-17 DIAGNOSIS — F329 Major depressive disorder, single episode, unspecified: Secondary | ICD-10-CM

## 2023-08-17 DIAGNOSIS — I10 Essential (primary) hypertension: Secondary | ICD-10-CM

## 2023-08-20 NOTE — Telephone Encounter (Signed)
 Requested Prescriptions  Pending Prescriptions Disp Refills   hydrochlorothiazide  (HYDRODIURIL ) 25 MG tablet [Pharmacy Med Name: HYDROCHLOROTHIAZIDE  25 MG TAB] 30 tablet 2    Sig: TAKE 1 TABLET (25 MG TOTAL) BY MOUTH DAILY.     Cardiovascular: Diuretics - Thiazide Passed - 08/20/2023  8:07 AM      Passed - Cr in normal range and within 180 days    Creat  Date Value Ref Range Status  03/21/2023 0.74 0.50 - 1.03 mg/dL Final   Creatinine, Urine  Date Value Ref Range Status  06/25/2023 69 20 - 275 mg/dL Final         Passed - K in normal range and within 180 days    Potassium  Date Value Ref Range Status  03/21/2023 4.1 3.5 - 5.3 mmol/L Final         Passed - Na in normal range and within 180 days    Sodium  Date Value Ref Range Status  03/21/2023 138 135 - 146 mmol/L Final  10/05/2016 138 134 - 144 mmol/L Final         Passed - Last BP in normal range    BP Readings from Last 1 Encounters:  06/25/23 120/76         Passed - Valid encounter within last 6 months    Recent Outpatient Visits           1 month ago Type 2 diabetes mellitus with hyperglycemia, without long-term current use of insulin [facility])   [place] [facility] Rockney Cid, DO       Future Appointments             In 1 month Rockney Cid, DO [place] [facility], [facility]             escitalopram  (LEXAPRO ) 10 MG tablet [Pharmacy Med Name: ESCITALOPRAM  10 MG TABLET] 30 tablet 2    Sig: TAKE 1 TABLET BY MOUTH EVERY DAY     Psychiatry:  Antidepressants - SSRI Passed - 08/20/2023  8:07 AM      Passed - Completed PHQ-2 or PHQ-9 in the last 360 days      Passed - Valid encounter within last 6 months    Recent Outpatient Visits           1 month ago Type 2 diabetes mellitus with hyperglycemia, without long-term current use of insulin [facility])   [place] [facility] Rockney Cid, DO       Future Appointments             In 1 month  Rockney Cid, DO [facility], [facility]             lisinopril  (ZESTRIL ) 20 MG tablet [Pharmacy Med Name: LISINOPRIL  20 MG TABLET] 30 tablet 2    Sig: TAKE 1 TABLET BY MOUTH EVERY DAY     Cardiovascular:  ACE Inhibitors Passed - 08/20/2023  8:07 AM      Passed - Cr in normal range and within 180 days    Creat  Date Value Ref Range Status  03/21/2023 0.74 0.50 - 1.03 mg/dL Final   Creatinine, Urine  Date Value Ref Range Status  06/25/2023 69 20 - 275 mg/dL Final         Passed - K in normal range and within 180 days    Potassium  Date Value Ref Range Status  03/21/2023 4.1 3.5 - 5.3 mmol/L Final  Passed - Patient is not pregnant      Passed - Last BP in normal range    BP Readings from Last 1 Encounters:  06/25/23 120/76         Passed - Valid encounter within last 6 months    Recent Outpatient Visits           1 month ago Type 2 diabetes mellitus with hyperglycemia, without long-term current use of insulin [facility])   [place] [facility] Rockney Cid, DO       Future Appointments             In 1 month Rockney Cid, DO [facility], [facility]

## 2023-09-03 ENCOUNTER — Other Ambulatory Visit: Payer: Self-pay | Admitting: Internal Medicine

## 2023-09-03 DIAGNOSIS — E1165 Type 2 diabetes mellitus with hyperglycemia: Secondary | ICD-10-CM

## 2023-09-03 NOTE — Telephone Encounter (Signed)
 Copied from CRM [id number]. Topic: Clinical - Medication Refill >> Sep 03, 2023  1:04 PM Carlatta H wrote: Medication: Dulaglutide (TRULICITY) 1.5 MG/0.5ML SOAJ  Has the patient contacted their pharmacy? No (Agent: If no, request that the patient contact the pharmacy for the refill. If patient does not wish to contact the pharmacy document the reason why and proceed with request.) (Agent: If yes, when and what did the pharmacy advise?)  This is the patient's preferred pharmacy:  [phone] [place], [place] - [address] [place] [address] Phone: [phone] Fax: [phone]    Is this the correct pharmacy for this prescription? Yes If no, delete pharmacy and type the correct one.   Has the prescription been filled recently? No  Is the patient out of the medication? Yes  Has the patient been seen for an appointment in the last year OR does the patient have an upcoming appointment? Yes  Can we respond through MyChart? Yes  Agent: Please be advised that Rx refills may take up to 3 business days. We ask that you follow-up with your pharmacy.

## 2023-09-05 MED ORDER — TRULICITY 1.5 MG/0.5ML ~~LOC~~ SOAJ: 1.5000 mg | SUBCUTANEOUS | 0 refills | Status: DC

## 2023-09-05 NOTE — Telephone Encounter (Signed)
 Requested Prescriptions  Pending Prescriptions Disp Refills   Dulaglutide (TRULICITY) 1.5 MG/0.5ML SOAJ 2 mL 0    Sig: Inject 1.5 mg into the skin once a week.     Endocrinology:  Diabetes - GLP-1 Receptor Agonists Passed - 09/05/2023  5:43 PM      Passed - HBA1C is between 0 and 7.9 and within 180 days    Hemoglobin A1C  Date Value Ref Range Status  06/25/2023 5.7 (A) 4.0 - 5.6 % Final   Hgb A1c MFr Bld  Date Value Ref Range Status  03/21/2023 6.8 (H) <5.7 % of total Hgb Final    Comment:    For someone without known diabetes, a hemoglobin A1c value of 6.5% or greater indicates that they may have  diabetes and this should be confirmed with a follow-up  test. . For someone with known diabetes, a value <7% indicates  that their diabetes is well controlled and a value  greater than or equal to 7% indicates suboptimal  control. A1c targets should be individualized based on  duration of diabetes, age, comorbid conditions, and  other considerations. . Currently, no consensus exists regarding use of hemoglobin A1c for diagnosis of diabetes for children. Temple Feeler - Valid encounter within last 6 months    Recent Outpatient Visits           2 months ago Type 2 diabetes mellitus with hyperglycemia, without long-term current use of insulin [facility])   [place] [facility] Rockney Cid, DO       Future Appointments             In 1 month Rockney Cid, DO [facility], [facility]

## 2023-09-10 ENCOUNTER — Other Ambulatory Visit: Payer: Self-pay | Admitting: Internal Medicine

## 2023-09-10 DIAGNOSIS — F329 Major depressive disorder, single episode, unspecified: Secondary | ICD-10-CM

## 2023-09-10 NOTE — Telephone Encounter (Signed)
 Requested Prescriptions  Pending Prescriptions Disp Refills   hydrOXYzine  (ATARAX ) 10 MG tablet [Pharmacy Med Name: HYDROXYZINE  HCL 10 MG TABLET] 60 tablet 2    Sig: TAKE 1 TABLET (10 MG TOTAL) BY MOUTH 2 (TWO) TIMES DAILY AS NEEDED FOR ITCHING OR ANXIETY.     Ear, Nose, and Throat:  Antihistamines 2 Passed - 09/10/2023  5:23 PM      Passed - Cr in normal range and within 360 days    Creat  Date Value Ref Range Status  03/21/2023 0.74 0.50 - 1.03 mg/dL Final   Creatinine, Urine  Date Value Ref Range Status  06/25/2023 69 20 - 275 mg/dL Final         Passed - Valid encounter within last 12 months    Recent Outpatient Visits           2 months ago Type 2 diabetes mellitus with hyperglycemia, without long-term current use of insulin [facility])   [place] [facility] Rockney Cid, DO       Future Appointments             In 4 weeks Rockney Cid, DO [facility], [facility]

## 2023-09-17 ENCOUNTER — Ambulatory Visit: Payer: Self-pay | Admitting: Internal Medicine

## 2023-10-02 ENCOUNTER — Other Ambulatory Visit: Payer: Self-pay | Admitting: Acute Care

## 2023-10-02 DIAGNOSIS — Z122 Encounter for screening for malignant neoplasm of respiratory organs: Secondary | ICD-10-CM

## 2023-10-02 DIAGNOSIS — Z87891 Personal history of nicotine dependence: Secondary | ICD-10-CM

## 2023-10-04 ENCOUNTER — Other Ambulatory Visit: Payer: Self-pay | Admitting: Internal Medicine

## 2023-10-04 DIAGNOSIS — F329 Major depressive disorder, single episode, unspecified: Secondary | ICD-10-CM

## 2023-10-07 NOTE — Telephone Encounter (Signed)
 Requested medication (s) are due for refill today: na  Requested medication (s) are on the active medication list: yes  Last refill:  09/10/23 #60 2 refills  Future visit scheduled: yes in 2 days   Notes to clinic:  Pharmacy comment: REQUEST FOR 90 DAYS PRESCRIPTION. DX Code Needed.  Do you want to refill?

## 2023-10-09 ENCOUNTER — Telehealth: Payer: Self-pay | Admitting: Pharmacy Technician

## 2023-10-09 ENCOUNTER — Ambulatory Visit: Payer: Self-pay | Admitting: Internal Medicine

## 2023-10-09 ENCOUNTER — Encounter: Payer: Self-pay | Admitting: Internal Medicine

## 2023-10-09 ENCOUNTER — Other Ambulatory Visit (HOSPITAL_COMMUNITY): Payer: Self-pay

## 2023-10-09 VITALS — BP 126/70 | HR 63 | Temp 98.0°F | Resp 18 | Ht 64.0 in | Wt 208.3 lb

## 2023-10-09 DIAGNOSIS — E782 Mixed hyperlipidemia: Secondary | ICD-10-CM | POA: Diagnosis not present

## 2023-10-09 DIAGNOSIS — E1165 Type 2 diabetes mellitus with hyperglycemia: Secondary | ICD-10-CM | POA: Diagnosis not present

## 2023-10-09 DIAGNOSIS — I10 Essential (primary) hypertension: Secondary | ICD-10-CM

## 2023-10-09 DIAGNOSIS — F329 Major depressive disorder, single episode, unspecified: Secondary | ICD-10-CM | POA: Diagnosis not present

## 2023-10-09 DIAGNOSIS — K219 Gastro-esophageal reflux disease without esophagitis: Secondary | ICD-10-CM

## 2023-10-09 LAB — POCT GLYCOSYLATED HEMOGLOBIN (HGB A1C): Hemoglobin A1C: 5.8 % — AB (ref 4.0–5.6)

## 2023-10-09 MED ORDER — TRULICITY 1.5 MG/0.5ML ~~LOC~~ SOAJ: 1.5000 mg | SUBCUTANEOUS | 1 refills | Status: DC

## 2023-10-09 MED ORDER — HYDROXYZINE HCL 10 MG PO TABS: 10.0000 mg | ORAL_TABLET | Freq: Two times a day (BID) | ORAL | 1 refills | Status: DC | PRN

## 2023-10-09 MED ORDER — LISINOPRIL 20 MG PO TABS: 20.0000 mg | ORAL_TABLET | Freq: Every day | ORAL | 1 refills | Status: DC

## 2023-10-09 MED ORDER — ESCITALOPRAM OXALATE 10 MG PO TABS: 10.0000 mg | ORAL_TABLET | Freq: Every day | ORAL | 1 refills | Status: DC

## 2023-10-09 MED ORDER — HYDROCHLOROTHIAZIDE 25 MG PO TABS: 25.0000 mg | ORAL_TABLET | Freq: Every day | ORAL | 1 refills | Status: DC

## 2023-10-09 NOTE — Telephone Encounter (Signed)
 Pharmacy Patient Advocate Encounter   Received notification from Onbase that prior authorization for Trulicity  1.5MG /0.5ML auto-injectors is required/requested.   Insurance verification completed.   The patient is insured through [facility] .   Per test claim: PA required; PA submitted to above mentioned insurance via CoverMyMeds Key/confirmation #/EOC [id number] Status is pending

## 2023-10-09 NOTE — Progress Notes (Signed)
 Established Patient Office Visit  Subjective   Patient ID: Morgan Gordon, female    DOB: July 22, 1970  Age: 53 y.o. MRN: [id number]  Chief Complaint  Patient presents with   Medical Management of Chronic Issues    Diabetes   Morgan Gordon presents to follow up on chronic medical conditions.   Discussed the use of AI scribe software for clinical note transcription with the patient, who gave verbal consent to proceed.  History of Present Illness Morgan Gordon is a 53 year old female with type 2 diabetes who presents for a follow-up visit to monitor her diabetes management and weight loss progress.  She has lost weight, currently at 208 pounds, down from 223 pounds in January, due to regular exercise and dietary modifications. She drinks 64-96 ounces of water daily. Her diabetes is managed with Trulicity  1.5 mg, with an A1c of 5.8 today, slightly up from 5.7 previously. She does not use metformin  due to past side effects.  She takes escitalopram  10 mg and hydroxyzine  twice daily for anxiety, with significant improvement in symptoms. She no longer experiences rashes or redness.  She manages hypertension with hydrochlorothiazide  and lisinopril . She has stopped Prilosec after dietary changes resolved her acid reflux. She exercises regularly, running three times a week, which benefits her stress and mental health. No new symptoms are reported.   Diabetes, Type 2: -Last A1c 5.7% 3/25 -Medications: Truclity increased to 1.5 mg - tried to prescribe Zepbound  for weight loss at LOV but not covered. Had been doing really well on the Trulicity , no side effects -Failed meds: metformin  due to side effects -Diet: cutting out fried foods, working on diet -Eye exam: Has an eye doctor, will up date them on diagnosis for diabetic eye exam -Foot exam: UTD -Microalbumin: UTD -Statin: yes -PNA vaccine: Discuss at follow up -Denies symptoms of hypoglycemia, polyuria, polydipsia, numbness  extremities, foot ulcers/trauma.   Hypertension: -Medications: HCTZ 25 mg, Lisinopril  20 mg  -Checking BP at home (average): 130-140/70-80 -Denies any SOB, CP, vision changes or symptoms of hypotension. Does have some LE edema on and off   HLD: -Medications: Crestor  increased to 20 mg - previously switched from Lipitor due to muscle pain, so far doing well with the change -Last lipid panel: Lipid Panel     Component Value Date/Time   CHOL 198 03/21/2023 1129   CHOL 152 10/05/2016 0825   TRIG 153 (H) 03/21/2023 1129   HDL 38 (L) 03/21/2023 1129   HDL 53 10/05/2016 0825   CHOLHDL 5.2 (H) 03/21/2023 1129   LDLCALC 132 (H) 03/21/2023 1129   LABVLDL 10 10/05/2016 0825    GERD: -Symptoms resolved since changing diet, has not required PPI in several months   MDD: -Currently on Lexapro  10 mg, also has Hydroxyzine  10 mg BID - this combination is working really well for her and she reports no side effects.      10/09/2023    9:33 AM 06/25/2023    9:42 AM 03/19/2023    9:56 AM 09/14/2022    9:48 AM 07/12/2022   10:34 AM  Depression screen PHQ 2/9  Decreased Interest 0 0 0 0 0  Down, Depressed, Hopeless 0 0 0 0 0  PHQ - 2 Score 0 0 0 0 0  Altered sleeping 0   0 0  Tired, decreased energy 0   0 0  Change in appetite 0   0 0  Feeling bad or failure about yourself  0   0  0  Trouble concentrating 0   0 0  Moving slowly or fidgety/restless 0   0 0  Suicidal thoughts 0   0 0  PHQ-9 Score 0   0 0  Difficult doing work/chores Not difficult at all   Not difficult at all Not difficult at all    Health Maintenance: -Blood work UTD -Mammogram 3/24, Birads-1, due for this year (ordered previously) -Colon cancer screening: Cologuard 2/24 negative  -Pap negative 4/24 -Lung cancer screening done 09/13/22    Review of Systems  All other systems reviewed and are negative.     Objective:     BP 126/70   Pulse 63   Temp 98 F (36.7 C)   Resp 18   Ht 5' 4 (1.626 m)   Wt 208 lb 4.8 oz  (94.5 kg)   LMP 12/23/2016 (Approximate) Comment: period every 3 months due to hormone therapy - preg test neg  SpO2 98%   BMI 35.75 kg/m  BP Readings from Last 3 Encounters:  10/09/23 126/70  06/25/23 120/76  05/03/23 132/82   Wt Readings from Last 3 Encounters:  10/09/23 208 lb 4.8 oz (94.5 kg)  06/25/23 216 lb 1.6 oz (98 kg)  05/03/23 223 lb (101.2 kg)      Physical Exam Constitutional:      Appearance: Normal appearance.  HENT:     Head: Normocephalic and atraumatic.  Eyes:     Conjunctiva/sclera: Conjunctivae normal.  Cardiovascular:     Rate and Rhythm: Normal rate and regular rhythm.  Pulmonary:     Effort: Pulmonary effort is normal.     Breath sounds: Normal breath sounds.  Skin:    General: Skin is warm and dry.  Neurological:     General: No focal deficit present.     Mental Status: She is alert. Mental status is at baseline.  Psychiatric:        Mood and Affect: Mood normal.        Behavior: Behavior normal.     No results found for any visits on 10/09/23.   Last CBC Lab Results  Component Value Date   WBC 10.1 03/21/2023   HGB 14.4 03/21/2023   HCT 43.2 03/21/2023   MCV 81.1 03/21/2023   MCH 27.0 03/21/2023   RDW 14.0 03/21/2023   PLT 412 (H) 03/21/2023   Last metabolic panel Lab Results  Component Value Date   GLUCOSE 107 (H) 03/21/2023   NA 138 03/21/2023   K 4.1 03/21/2023   CL 100 03/21/2023   CO2 28 03/21/2023   BUN 14 03/21/2023   CREATININE 0.74 03/21/2023   EGFR 97 03/21/2023   CALCIUM  9.8 03/21/2023   PROT 7.0 03/21/2023   ALBUMIN 4.4 10/05/2016   LABGLOB 2.3 10/05/2016   AGRATIO 1.9 10/05/2016   BILITOT 0.4 03/21/2023   ALKPHOS 46 10/05/2016   AST 16 03/21/2023   ALT 20 03/21/2023   ANIONGAP 7 08/13/2016   Last lipids Lab Results  Component Value Date   CHOL 198 03/21/2023   HDL 38 (L) 03/21/2023   LDLCALC 132 (H) 03/21/2023   TRIG 153 (H) 03/21/2023   CHOLHDL 5.2 (H) 03/21/2023   Last hemoglobin A1c Lab  Results  Component Value Date   HGBA1C 5.7 (A) 06/25/2023   Last thyroid functions Lab Results  Component Value Date   TSH 2.31 03/21/2023   Last vitamin D  Lab Results  Component Value Date   VD25OH 35 04/03/2016   Last vitamin B12 and Folate No  results found for: VITAMINB12, FOLATE    The 10-year ASCVD risk score (Arnett DK, et al., 2019) is: 5.6%    Assessment & Plan:   Assessment & Plan Type 2 Diabetes Mellitus Diabetes well-controlled with A1c of 5.8. Trulicity  effective for glucose management and weight loss. Current dose maintained due to efficacy and tolerance. - Continue Trulicity  1.5 mg. - Refill Trulicity  for six months. - Monitor A1c every six months. - Encourage continued lifestyle modifications for diabetes management.  Hypertension Hypertension well-controlled with hydrochlorothiazide  and lisinopril . - Change hydrochlorothiazide  and lisinopril  prescriptions to 90-day supplies with refills.  Anxiety Anxiety well-managed with escitalopram  and hydroxyzine . Significant symptom improvement reported. - Continue escitalopram  10 mg. - Refill escitalopram  for 90 days. - Continue hydroxyzine  twice daily. - Refill hydroxyzine  for 180 days.  Gastroesophageal Reflux Disease (GERD) GERD symptoms resolved with dietary changes. No omeprazole needed. - Discontinue omeprazole from medication list.  HLD Stable, doing well on Crestor  20 mg. - Plan to recheck fasting labs at follow up.   General Health Maintenance Actively engaged in weight loss and lifestyle modifications. Aware of hydration importance. Due for routine labs in December. - Encourage continued weight loss and exercise. - Advise on hydration goals: aim for at least 96 ounces of water daily. - Schedule routine lab work, including cholesterol, in December.  - POCT HgB A1C - Dulaglutide  (TRULICITY ) 1.5 MG/0.5ML SOAJ; Inject 1.5 mg into the skin once a week.  Dispense: 6 mL; Refill: 1 -  hydrochlorothiazide  (HYDRODIURIL ) 25 MG tablet; Take 1 tablet (25 mg total) by mouth daily.  Dispense: 90 tablet; Refill: 1 - lisinopril  (ZESTRIL ) 20 MG tablet; Take 1 tablet (20 mg total) by mouth daily.  Dispense: 90 tablet; Refill: 1 - escitalopram  (LEXAPRO ) 10 MG tablet; Take 1 tablet (10 mg total) by mouth daily.  Dispense: 90 tablet; Refill: 1 - hydrOXYzine  (ATARAX ) 10 MG tablet; Take 1 tablet (10 mg total) by mouth 2 (two) times daily as needed for itching or anxiety.  Dispense: 180 tablet; Refill: 1   Return in about 6 months (around 04/10/2024).    Sharyle Fischer, DO

## 2023-10-10 ENCOUNTER — Other Ambulatory Visit (HOSPITAL_COMMUNITY): Payer: Self-pay

## 2023-10-10 NOTE — Telephone Encounter (Signed)
 Pharmacy Patient Advocate Encounter  Received notification from [facility] that Prior Authorization for Trulicity  1.5MG /0.5ML auto-injectors has been APPROVED from 10/09/23 to 10/08/24. Ran test claim, Copay is $25.00. This test claim was processed through [facility]- copay amounts may vary at other pharmacies due to pharmacy/plan contracts, or as the patient moves through the different stages of their insurance plan.   PA #/Case ID/Reference #: [id number]

## 2023-10-14 ENCOUNTER — Other Ambulatory Visit (HOSPITAL_COMMUNITY): Payer: Self-pay

## 2023-10-14 ENCOUNTER — Telehealth: Payer: Self-pay

## 2023-10-14 NOTE — Telephone Encounter (Signed)
 Copied from CRM [id number]. Topic: Clinical - Medication Refill >> Sep 03, 2023  1:04 PM Carlatta H wrote: Medication: Dulaglutide  (TRULICITY ) 1.5 MG/0.5ML SOAJ  Has the patient contacted their pharmacy? No (Agent: If no, request that the patient contact the pharmacy for the refill. If patient does not wish to contact the pharmacy document the reason why and proceed with request.) (Agent: If yes, when and what did the pharmacy advise?)  This is the patient's preferred pharmacy:  [facility] [phone] [place], [place] - [address] [place] [address] Phone: [phone] Fax: [phone]    Is this the correct pharmacy for this prescription? Yes If no, delete pharmacy and type the correct one.   Has the prescription been filled recently? No  Is the patient out of the medication? Yes  Has the patient been seen for an appointment in the last year OR does the patient have an upcoming appointment? Yes  Can we respond through MyChart? Yes  Agent: Please be advised that Rx refills may take up to 3 business days. We ask that you follow-up with your pharmacy. >> Oct 14, 2023  8:41 AM Gustabo D wrote: Patient has not been able to get her Trulicity  for 2 weeks and is calling to see what's going on with it. Please call her back today.

## 2023-10-14 NOTE — Telephone Encounter (Signed)
 The PA has been approved, see telephone encounter 10/09/23

## 2023-10-14 NOTE — Telephone Encounter (Signed)
 Pt states trulicity  needs prior auth

## 2023-10-16 ENCOUNTER — Ambulatory Visit

## 2023-10-16 LAB — OPHTHALMOLOGY REPORT-SCANNED

## 2023-10-30 ENCOUNTER — Ambulatory Visit
Admission: RE | Admit: 2023-10-30 | Discharge: 2023-10-30 | Disposition: A | Discharge status: Home / Self Care | Source: Ambulatory Visit | Attending: Acute Care | Admitting: Acute Care

## 2023-10-30 DIAGNOSIS — Z122 Encounter for screening for malignant neoplasm of respiratory organs: Secondary | ICD-10-CM | POA: Insufficient documentation

## 2023-10-30 DIAGNOSIS — Z87891 Personal history of nicotine dependence: Secondary | ICD-10-CM | POA: Insufficient documentation

## 2023-11-07 ENCOUNTER — Other Ambulatory Visit: Payer: Self-pay

## 2023-11-07 DIAGNOSIS — Z122 Encounter for screening for malignant neoplasm of respiratory organs: Secondary | ICD-10-CM

## 2023-11-07 DIAGNOSIS — Z87891 Personal history of nicotine dependence: Secondary | ICD-10-CM

## 2024-01-27 ENCOUNTER — Other Ambulatory Visit: Payer: Self-pay

## 2024-03-21 ENCOUNTER — Other Ambulatory Visit: Payer: Self-pay | Admitting: Internal Medicine

## 2024-03-21 DIAGNOSIS — E1165 Type 2 diabetes mellitus with hyperglycemia: Secondary | ICD-10-CM

## 2024-03-24 NOTE — Telephone Encounter (Signed)
 Requested Prescriptions  Pending Prescriptions Disp Refills   TRULICITY  1.5 MG/0.5ML SOAJ [Pharmacy Med Name: TRULICITY  1.5 MG/0.5 ML PEN] 6 mL 0    Sig: INJECT 1.5 MG SUBCUTANEOUSLY ONCE A WEEK     Endocrinology:  Diabetes - GLP-1 Receptor Agonists Passed - 03/24/2024 11:47 AM      Passed - HBA1C is between 0 and 7.9 and within 180 days    Hemoglobin A1C  Date Value Ref Range Status  10/09/2023 5.8 (A) 4.0 - 5.6 % Final   Hgb A1c MFr Bld  Date Value Ref Range Status  03/21/2023 6.8 (H) <5.7 % of total Hgb Final    Comment:    For someone without known diabetes, a hemoglobin A1c value of 6.5% or greater indicates that they may have  diabetes and this should be confirmed with a follow-up  test. . For someone with known diabetes, a value <7% indicates  that their diabetes is well controlled and a value  greater than or equal to 7% indicates suboptimal  control. A1c targets should be individualized based on  duration of diabetes, age, comorbid conditions, and  other considerations. . Currently, no consensus exists regarding use of hemoglobin A1c for diagnosis of diabetes for children. SABRA Amy - Valid encounter within last 6 months    Recent Outpatient Visits           5 months ago Type 2 diabetes mellitus with hyperglycemia, without long-term current use of insulin [facility])   [place] [facility] Bernardo Fend, DO   9 months ago Type 2 diabetes mellitus with hyperglycemia, without long-term current use of insulin [facility])   [facility] Bernardo Fend, [place]

## 2024-04-08 ENCOUNTER — Other Ambulatory Visit: Payer: Self-pay

## 2024-04-08 ENCOUNTER — Ambulatory Visit: Admitting: Internal Medicine

## 2024-04-08 ENCOUNTER — Encounter: Payer: Self-pay | Admitting: Internal Medicine

## 2024-04-08 VITALS — BP 124/80 | HR 76 | Temp 97.9°F | Resp 16 | Ht 64.0 in | Wt 212.5 lb

## 2024-04-08 DIAGNOSIS — F330 Major depressive disorder, recurrent, mild: Secondary | ICD-10-CM

## 2024-04-08 DIAGNOSIS — E7800 Pure hypercholesterolemia, unspecified: Secondary | ICD-10-CM

## 2024-04-08 DIAGNOSIS — Z7985 Long-term (current) use of injectable non-insulin antidiabetic drugs: Secondary | ICD-10-CM

## 2024-04-08 DIAGNOSIS — Z6836 Body mass index (BMI) 36.0-36.9, adult: Secondary | ICD-10-CM | POA: Diagnosis not present

## 2024-04-08 DIAGNOSIS — Z23 Encounter for immunization: Secondary | ICD-10-CM | POA: Diagnosis not present

## 2024-04-08 DIAGNOSIS — I10 Essential (primary) hypertension: Secondary | ICD-10-CM

## 2024-04-08 DIAGNOSIS — E66812 Obesity, class 2: Secondary | ICD-10-CM | POA: Diagnosis not present

## 2024-04-08 DIAGNOSIS — E1165 Type 2 diabetes mellitus with hyperglycemia: Secondary | ICD-10-CM

## 2024-04-08 DIAGNOSIS — E559 Vitamin D deficiency, unspecified: Secondary | ICD-10-CM

## 2024-04-08 MED ORDER — ESCITALOPRAM OXALATE 10 MG PO TABS: 10.0000 mg | ORAL_TABLET | Freq: Every day | ORAL | 1 refills | Status: DC

## 2024-04-08 MED ORDER — HYDROCHLOROTHIAZIDE 25 MG PO TABS: 25.0000 mg | ORAL_TABLET | Freq: Every day | ORAL | 1 refills | Status: AC

## 2024-04-08 MED ORDER — HYDROXYZINE HCL 10 MG PO TABS: 10.0000 mg | ORAL_TABLET | Freq: Three times a day (TID) | ORAL | 1 refills | Status: DC | PRN

## 2024-04-08 MED ORDER — TRULICITY 3 MG/0.5ML ~~LOC~~ SOAJ: 3.0000 mg | SUBCUTANEOUS | 1 refills | Status: AC

## 2024-04-08 MED ORDER — LISINOPRIL 20 MG PO TABS: 20.0000 mg | ORAL_TABLET | Freq: Every day | ORAL | 1 refills | Status: AC

## 2024-04-08 NOTE — Progress Notes (Signed)
 "  Established Patient Office Visit  Subjective   Patient ID: Morgan Gordon, female    DOB: [birth year]  Age: 53 y.o. MRN: [id number]  Chief Complaint  Patient presents with   Medical Management of Chronic Issues    6 month recheck    Diabetes   Morgan Gordon presents to follow up on chronic medical conditions.   Discussed the use of AI scribe software for clinical note transcription with the patient, who gave verbal consent to proceed.  History of Present Illness  BAILEE Gordon is a 53 year old female who presents for routine lab work and weight management.  She has a lapse in insurance coverage this month and wants to complete labs now, including anemia panel, kidney and liver function, electrolytes, cholesterol, A1c, and vitamin D .  She has gained 12 pounds since her last visit, from 200 to 212 pounds, and finds weight control difficult, especially over the holidays. She is taking Trulicity  1.5 mg, and her last A1c was 5.5%.  She has thin abdominal skin in the prior pregnancy area that tears and becomes irritated and itchy. She uses topical antibiotics for these areas.  She is taking Trulicity , Lexapro  for depression and anxiety, hydroxyzine  for anxiety, hydrochlorothiazide , lisinopril , Crestor , vitamin D , and a magnesium supplement for sleep and muscle twitches.  She has diabetes and is concerned about kidney health and the risk of kidney stones with vitamin D .  She is tolerating her current medications and manages anxiety with hydroxyzine , which she is trying to stretch through January due to the temporary insurance gap.   Diabetes, Type 2: -Last A1c 5.8% 7/25 -Medications: Truclity increased to 1.5 mg - tried to prescribe Zepbound  for weight loss at LOV but not covered. Had been doing really well on the Trulicity , no side effects -Failed meds: metformin  due to side effects -Diet: cutting out fried foods, working on diet -Eye exam: Has an eye doctor, will up  date them on diagnosis for diabetic eye exam -Foot exam: UTD -Microalbumin: UTD -Statin: yes -PNA vaccine: Due today -Denies symptoms of hypoglycemia, polyuria, polydipsia, numbness extremities, foot ulcers/trauma.   Hypertension: -Medications: HCTZ 25 mg, Lisinopril  20 mg  -Checking BP at home (average): 130-140/70-80 -Denies any SOB, CP, vision changes or symptoms of hypotension. Does have some LE edema on and off   HLD: -Medications: Crestor  increased to 20 mg - previously switched from Lipitor due to muscle pain, so far doing well with the change -Last lipid panel: Lipid Panel     Component Value Date/Time   CHOL 198 03/21/2023 1129   CHOL 152 10/05/2016 0825   TRIG 153 (H) 03/21/2023 1129   HDL 38 (L) 03/21/2023 1129   HDL 53 10/05/2016 0825   CHOLHDL 5.2 (H) 03/21/2023 1129   LDLCALC 132 (H) 03/21/2023 1129   LABVLDL 10 10/05/2016 0825    GERD: -Symptoms resolved since changing diet, has not required PPI in several months   MDD: -Currently on Lexapro  10 mg, also has Hydroxyzine  10 mg BID - this combination is working really well for her and she reports no side effects.      04/08/2024    7:57 AM 10/09/2023    9:33 AM 06/25/2023    9:42 AM 03/19/2023    9:56 AM 09/14/2022    9:48 AM  Depression screen PHQ 2/9  Decreased Interest 0 0 0 0 0  Down, Depressed, Hopeless 0 0 0 0 0  PHQ - 2 Score 0 0 0 0 0  Altered sleeping  0   0  Tired, decreased energy  0   0  Change in appetite  0   0  Feeling bad or failure about yourself   0   0  Trouble concentrating  0   0  Moving slowly or fidgety/restless  0   0  Suicidal thoughts  0   0  PHQ-9 Score  0    0   Difficult doing work/chores  Not difficult at all   Not difficult at all     Data saved with a previous flowsheet row definition    Health Maintenance: -Blood work due -Mammogram 3/24, Birads-1, due for this year (ordered previously) - patient will call to schedule -Colon cancer screening: Cologuard 2/24 negative   -Pap negative 4/24 -Lung cancer screening done 09/13/22 -Prevnar 20 today    Review of Systems  All other systems reviewed and are negative.     Objective:     BP 124/80 (Cuff Size: Large)   Pulse 76   Temp 97.9 F (36.6 C) (Oral)   Resp 16   Ht 5' 4 (1.626 m)   Wt 212 lb 8 oz (96.4 kg)   LMP 12/23/2016   SpO2 98%   BMI 36.48 kg/m  BP Readings from Last 3 Encounters:  04/08/24 124/80  10/09/23 126/70  06/25/23 120/76   Wt Readings from Last 3 Encounters:  04/08/24 212 lb 8 oz (96.4 kg)  10/09/23 208 lb 4.8 oz (94.5 kg)  06/25/23 216 lb 1.6 oz (98 kg)      Physical Exam Constitutional:      Appearance: Normal appearance.  HENT:     Head: Normocephalic and atraumatic.  Eyes:     Conjunctiva/sclera: Conjunctivae normal.  Cardiovascular:     Rate and Rhythm: Normal rate and regular rhythm.  Pulmonary:     Effort: Pulmonary effort is normal.     Breath sounds: Normal breath sounds.  Skin:    General: Skin is warm and dry.     Comments: Area of thinning skin on lower abdominal wall under pannus, no signs of infection currently  Neurological:     General: No focal deficit present.     Mental Status: She is alert. Mental status is at baseline.  Psychiatric:        Mood and Affect: Mood normal.        Behavior: Behavior normal.     No results found for any visits on 04/08/24.   Last CBC Lab Results  Component Value Date   WBC 10.1 03/21/2023   HGB 14.4 03/21/2023   HCT 43.2 03/21/2023   MCV 81.1 03/21/2023   MCH 27.0 03/21/2023   RDW 14.0 03/21/2023   PLT 412 (H) 03/21/2023   Last metabolic panel Lab Results  Component Value Date   GLUCOSE 107 (H) 03/21/2023   NA 138 03/21/2023   K 4.1 03/21/2023   CL 100 03/21/2023   CO2 28 03/21/2023   BUN 14 03/21/2023   CREATININE 0.74 03/21/2023   EGFR 97 03/21/2023   CALCIUM  9.8 03/21/2023   PROT 7.0 03/21/2023   ALBUMIN 4.4 10/05/2016   LABGLOB 2.3 10/05/2016   AGRATIO 1.9 10/05/2016   BILITOT  0.4 03/21/2023   ALKPHOS 46 10/05/2016   AST 16 03/21/2023   ALT 20 03/21/2023   ANIONGAP 7 08/13/2016   Last lipids Lab Results  Component Value Date   CHOL 198 03/21/2023   HDL 38 (L) 03/21/2023   LDLCALC 132 (H)  03/21/2023   TRIG 153 (H) 03/21/2023   CHOLHDL 5.2 (H) 03/21/2023   Last hemoglobin A1c Lab Results  Component Value Date   HGBA1C 5.8 (A) 10/09/2023   Last thyroid functions Lab Results  Component Value Date   TSH 2.31 03/21/2023   Last vitamin D  Lab Results  Component Value Date   VD25OH 35 04/03/2016   Last vitamin B12 and Folate No results found for: VITAMINB12, FOLATE    The 10-year ASCVD risk score (Arnett DK, et al., 2019) is: 5.8%    Assessment & Plan:   Assessment & Plan  Type 2 diabetes mellitus w/Hyperglycemia  Well-controlled with A1c of 5.8%. - Increased Trulicity  to 3 mg for appetite suppression and weight loss. - Ordered A1c test.  Obesity Weight gain of 4 pounds since July, currently 212 pounds. Trulicity  used for weight management. - Increased Trulicity  to 3 mg for weight management. - Discussed potential use of abdominal binders to reduce pressure on thin skin areas. - Advised keeping the area dry and clean.  Primary hypertension Managed with hydrochlorothiazide  and lisinopril . - Continue current antihypertensive regimen.  Pure hypercholesterolemia Managed with Crestor . - Continue Crestor . - Recheck lipid panel today.   Major depressive disorder, recurrent Managed with escitalopram  and hydroxyzine . Adjusting hydroxyzine  dosage for anxiety management. - Continue escitalopram  and hydroxyzine . - Adjusted hydroxyzine  prescription for flexible dosing.  Vitamin D  deficiency Vitamin D  levels to be checked. - Ordered vitamin D  level test.  General Health Maintenance Routine health maintenance discussed. Pneumonia vaccine recommended due to increased risk with diabetes. - Administered Prevnar 20 vaccine. - Ordered  routine labs including CBC, kidney and liver function tests, electrolytes, cholesterol, and vitamin D  level. - Ensure mammogram is scheduled within the next year.  - HgB A1c - Dulaglutide  (TRULICITY ) 3 MG/0.5ML SOAJ; Inject 3 mg as directed once a week.  Dispense: 6 mL; Refill: 1 - CBC w/Diff/Platelet - Comprehensive Metabolic Panel (CMET) - hydrochlorothiazide  (HYDRODIURIL ) 25 MG tablet; Take 1 tablet (25 mg total) by mouth daily.  Dispense: 90 tablet; Refill: 1 - lisinopril  (ZESTRIL ) 20 MG tablet; Take 1 tablet (20 mg total) by mouth daily.  Dispense: 90 tablet; Refill: 1 - Lipid Profile - escitalopram  (LEXAPRO ) 10 MG tablet; Take 1 tablet (10 mg total) by mouth daily.  Dispense: 90 tablet; Refill: 1 - hydrOXYzine  (ATARAX ) 10 MG tablet; Take 1 tablet (10 mg total) by mouth 3 (three) times daily as needed for itching or anxiety.  Dispense: 270 tablet; Refill: 1 - Vitamin D  (25 hydroxy) - Pneumococcal conjugate vaccine 20-valent (Prevnar 20)   Return in about 3 months (around 07/07/2024).   Sharyle Fischer, DO "

## 2024-04-09 LAB — COMPREHENSIVE METABOLIC PANEL WITH GFR
AG Ratio: 1.7 (calc) (ref 1.0–2.5)
ALT: 24 U/L (ref 6–29)
AST: 15 U/L (ref 10–35)
Albumin: 4.4 g/dL (ref 3.6–5.1)
Alkaline phosphatase (APISO): 70 U/L (ref 37–153)
BUN: 15 mg/dL (ref 7–25)
CO2: 29 mmol/L (ref 20–32)
Calcium: 9.8 mg/dL (ref 8.6–10.4)
Chloride: 101 mmol/L (ref 98–110)
Creat: 0.83 mg/dL (ref 0.50–1.03)
Globulin: 2.6 g/dL (ref 1.9–3.7)
Glucose, Bld: 83 mg/dL (ref 65–99)
Potassium: 4.3 mmol/L (ref 3.5–5.3)
Sodium: 139 mmol/L (ref 135–146)
Total Bilirubin: 0.3 mg/dL (ref 0.2–1.2)
Total Protein: 7 g/dL (ref 6.1–8.1)
eGFR: 84 mL/min/1.73m2

## 2024-04-09 LAB — LIPID PANEL
Cholesterol: 162 mg/dL
HDL: 34 mg/dL — ABNORMAL LOW
Non-HDL Cholesterol (Calc): 128 mg/dL
Total CHOL/HDL Ratio: 4.8 (calc)
Triglycerides: 409 mg/dL — ABNORMAL HIGH

## 2024-04-09 LAB — CBC WITH DIFFERENTIAL/PLATELET
Absolute Lymphocytes: 2327 {cells}/uL (ref 850–3900)
Absolute Monocytes: 901 {cells}/uL (ref 200–950)
Basophils Absolute: 99 {cells}/uL (ref 0–200)
Basophils Relative: 1 %
Eosinophils Absolute: 297 {cells}/uL (ref 15–500)
Eosinophils Relative: 3 %
HCT: 43.1 % (ref 35.9–46.0)
Hemoglobin: 14 g/dL (ref 11.7–15.5)
MCH: 26.7 pg — ABNORMAL LOW (ref 27.0–33.0)
MCHC: 32.5 g/dL (ref 31.6–35.4)
MCV: 82.3 fL (ref 81.4–101.7)
MPV: 9.5 fL (ref 7.5–12.5)
Monocytes Relative: 9.1 %
Neutro Abs: 6277 {cells}/uL (ref 1500–7800)
Neutrophils Relative %: 63.4 %
Platelets: 410 Thousand/uL — ABNORMAL HIGH (ref 140–400)
RBC: 5.24 Million/uL — ABNORMAL HIGH (ref 3.80–5.10)
RDW: 14.2 % (ref 11.0–15.0)
Total Lymphocyte: 23.5 %
WBC: 9.9 Thousand/uL (ref 3.8–10.8)

## 2024-04-09 LAB — HEMOGLOBIN A1C
Hgb A1c MFr Bld: 6.1 % — ABNORMAL HIGH
Mean Plasma Glucose: 128 mg/dL
eAG (mmol/L): 7.1 mmol/L

## 2024-04-09 LAB — VITAMIN D 25 HYDROXY (VIT D DEFICIENCY, FRACTURES): Vit D, 25-Hydroxy: >150 ng/mL — ABNORMAL HIGH (ref 30–100)

## 2024-04-10 ENCOUNTER — Ambulatory Visit: Payer: Self-pay | Admitting: Internal Medicine

## 2024-04-10 DIAGNOSIS — E782 Mixed hyperlipidemia: Secondary | ICD-10-CM

## 2024-04-10 MED ORDER — FENOFIBRATE 48 MG PO TABS: 48.0000 mg | ORAL_TABLET | Freq: Every day | ORAL | 1 refills | Status: AC

## 2024-05-14 ENCOUNTER — Other Ambulatory Visit: Payer: Self-pay | Admitting: Internal Medicine

## 2024-05-14 DIAGNOSIS — F330 Major depressive disorder, recurrent, mild: Secondary | ICD-10-CM

## 2024-05-14 NOTE — Telephone Encounter (Unsigned)
 Copied from CRM #[id number]. Topic: Clinical - Medication Refill >> May 14, 2024  1:53 PM Robinson H wrote: Medication: escitalopram  (LEXAPRO ) 10 MG tablet and hydrOXYzine  (ATARAX ) 10 MG tablet  Has the patient contacted their pharmacy? Yes, pharmacy calling [place] (Agent: If no, request that the patient contact the pharmacy for the refill. If patient does not wish to contact the pharmacy document the reason why and proceed with request.) (Agent: If yes, when and what did the pharmacy advise?)  This is the patient's preferred pharmacy:    [email or web address] [place], [place] - [address] [place] [address] Phone: [phone] Fax: [phone]  Is this the correct pharmacy for this prescription? Yes If no, delete pharmacy and type the correct one.   Has the prescription been filled recently? No  Is the patient out of the medication? Not sure  Has the patient been seen for an appointment in the last year OR does the patient have an upcoming appointment? Yes  Can we respond through MyChart? No  Agent: Please be advised that Rx refills may take up to 3 business days. We ask that you follow-up with your pharmacy.

## 2024-05-15 MED ORDER — ESCITALOPRAM OXALATE 10 MG PO TABS: 10.0000 mg | ORAL_TABLET | Freq: Every day | ORAL | 1 refills | Status: AC

## 2024-05-15 MED ORDER — HYDROXYZINE HCL 10 MG PO TABS: 10.0000 mg | ORAL_TABLET | Freq: Three times a day (TID) | ORAL | 1 refills | Status: AC | PRN

## 2024-05-15 NOTE — Telephone Encounter (Signed)
 Requested Prescriptions  Pending Prescriptions Disp Refills   escitalopram  (LEXAPRO ) 10 MG tablet 90 tablet 1    Sig: Take 1 tablet (10 mg total) by mouth daily.     Psychiatry:  Antidepressants - SSRI Passed - 05/15/2024  3:31 PM      Passed - Completed PHQ-2 or PHQ-9 in the last 360 days      Passed - Valid encounter within last 6 months    Recent Outpatient Visits           1 month ago Type 2 diabetes mellitus with hyperglycemia, without long-term current use of insulin [facility])   [facility] Bernardo Fend, [place]   7 months ago Type 2 diabetes mellitus with hyperglycemia, without long-term current use of insulin [facility])   [place] [facility] Bernardo Fend, DO   10 months ago Type 2 diabetes mellitus with hyperglycemia, without long-term current use of insulin [facility])   [facility] Bernardo Fend, DO               hydrOXYzine  (ATARAX ) 10 MG tablet 270 tablet 1    Sig: Take 1 tablet (10 mg total) by mouth 3 (three) times daily as needed for itching or anxiety.     Ear, Nose, and Throat:  Antihistamines 2 Passed - 05/15/2024  3:31 PM      Passed - Cr in normal range and within 360 days    Creat  Date Value Ref Range Status  04/08/2024 0.83 0.50 - 1.03 mg/dL Final   Creatinine, Urine  Date Value Ref Range Status  06/25/2023 69 20 - 275 mg/dL Final         Passed - Valid encounter within last 12 months    Recent Outpatient Visits           1 month ago Type 2 diabetes mellitus with hyperglycemia, without long-term current use of insulin [facility])   [facility] Bernardo Fend, DO   7 months ago Type 2 diabetes mellitus with hyperglycemia, without long-term current use of insulin [facility])   [facility] Bernardo Fend, DO   10 months ago Type 2 diabetes mellitus with hyperglycemia, without long-term current use of insulin [facility])   [facility]  [facility] Bernardo Fend, [place]

## 2024-07-08 ENCOUNTER — Ambulatory Visit: Admitting: Internal Medicine
